# Patient Record
Sex: Female | Born: 1955 | Race: Black or African American | Hispanic: No | Marital: Married | Smoking: Never smoker
Health system: Southern US, Community
[De-identification: ages and names within clinical notes are randomized; demographics above are authoritative.]

## PROBLEM LIST (undated history)

## (undated) DIAGNOSIS — I10 Essential (primary) hypertension: Secondary | ICD-10-CM

## (undated) DIAGNOSIS — I219 Acute myocardial infarction, unspecified: Secondary | ICD-10-CM

## (undated) DIAGNOSIS — D649 Anemia, unspecified: Secondary | ICD-10-CM

## (undated) DIAGNOSIS — E669 Obesity, unspecified: Secondary | ICD-10-CM

## (undated) DIAGNOSIS — E785 Hyperlipidemia, unspecified: Secondary | ICD-10-CM

## (undated) HISTORY — DX: Acute myocardial infarction, unspecified: I21.9

## (undated) HISTORY — DX: Hyperlipidemia, unspecified: E78.5

## (undated) HISTORY — PX: CARDIAC CATHETERIZATION: SHX172

## (undated) HISTORY — PX: TUBAL LIGATION: SHX77

## (undated) HISTORY — DX: Obesity, unspecified: E66.9

## (undated) HISTORY — DX: Essential (primary) hypertension: I10

---

## 2004-04-24 ENCOUNTER — Emergency Department (HOSPITAL_COMMUNITY): Admission: EM | Admit: 2004-04-24 | Discharge: 2004-04-24 | Payer: Self-pay | Admitting: Emergency Medicine

## 2004-05-10 ENCOUNTER — Ambulatory Visit (HOSPITAL_COMMUNITY): Admission: RE | Admit: 2004-05-10 | Discharge: 2004-05-10 | Payer: Self-pay | Admitting: Internal Medicine

## 2004-07-27 ENCOUNTER — Encounter (HOSPITAL_COMMUNITY): Admission: RE | Admit: 2004-07-27 | Discharge: 2004-08-26 | Payer: Self-pay | Admitting: Oncology

## 2004-07-27 ENCOUNTER — Encounter: Admission: RE | Admit: 2004-07-27 | Discharge: 2004-07-27 | Payer: Self-pay | Admitting: Oncology

## 2004-11-16 ENCOUNTER — Encounter (HOSPITAL_COMMUNITY): Admission: RE | Admit: 2004-11-16 | Discharge: 2004-12-16 | Payer: Self-pay | Admitting: Oncology

## 2004-11-16 ENCOUNTER — Ambulatory Visit (HOSPITAL_COMMUNITY): Payer: Self-pay | Admitting: Oncology

## 2004-11-16 ENCOUNTER — Encounter: Admission: RE | Admit: 2004-11-16 | Discharge: 2004-11-16 | Payer: Self-pay | Admitting: Oncology

## 2006-09-12 ENCOUNTER — Ambulatory Visit (HOSPITAL_COMMUNITY): Payer: Self-pay | Admitting: Oncology

## 2006-10-01 ENCOUNTER — Ambulatory Visit (HOSPITAL_COMMUNITY): Payer: Self-pay | Admitting: Oncology

## 2007-06-24 ENCOUNTER — Encounter (HOSPITAL_COMMUNITY): Admission: RE | Admit: 2007-06-24 | Discharge: 2007-07-16 | Payer: Self-pay | Admitting: Oncology

## 2007-07-03 ENCOUNTER — Ambulatory Visit (HOSPITAL_COMMUNITY): Payer: Self-pay | Admitting: Oncology

## 2007-08-23 ENCOUNTER — Encounter (HOSPITAL_COMMUNITY): Admission: RE | Admit: 2007-08-23 | Discharge: 2007-09-22 | Payer: Self-pay | Admitting: Oncology

## 2007-08-23 ENCOUNTER — Ambulatory Visit (HOSPITAL_COMMUNITY): Payer: Self-pay | Admitting: Oncology

## 2008-02-19 ENCOUNTER — Ambulatory Visit (HOSPITAL_COMMUNITY): Payer: Self-pay | Admitting: Oncology

## 2008-02-19 ENCOUNTER — Encounter (HOSPITAL_COMMUNITY): Admission: RE | Admit: 2008-02-19 | Discharge: 2008-03-20 | Payer: Self-pay | Admitting: Oncology

## 2008-04-29 ENCOUNTER — Ambulatory Visit (HOSPITAL_COMMUNITY): Payer: Self-pay | Admitting: Oncology

## 2008-06-02 ENCOUNTER — Ambulatory Visit (HOSPITAL_COMMUNITY): Admission: RE | Admit: 2008-06-02 | Discharge: 2008-06-02 | Payer: Self-pay | Admitting: Obstetrics & Gynecology

## 2010-03-22 ENCOUNTER — Ambulatory Visit: Payer: Self-pay

## 2011-01-23 ENCOUNTER — Inpatient Hospital Stay (HOSPITAL_COMMUNITY)
Admission: EM | Admit: 2011-01-23 | Discharge: 2011-01-26 | DRG: 247 | Disposition: A | Discharge status: Home / Self Care | Payer: PRIVATE HEALTH INSURANCE | Attending: Cardiology | Admitting: Cardiology

## 2011-01-23 DIAGNOSIS — Z7982 Long term (current) use of aspirin: Secondary | ICD-10-CM

## 2011-01-23 DIAGNOSIS — I2510 Atherosclerotic heart disease of native coronary artery without angina pectoris: Secondary | ICD-10-CM | POA: Diagnosis present

## 2011-01-23 DIAGNOSIS — Z794 Long term (current) use of insulin: Secondary | ICD-10-CM

## 2011-01-23 DIAGNOSIS — I2119 ST elevation (STEMI) myocardial infarction involving other coronary artery of inferior wall: Principal | ICD-10-CM | POA: Diagnosis present

## 2011-01-23 DIAGNOSIS — E669 Obesity, unspecified: Secondary | ICD-10-CM | POA: Diagnosis present

## 2011-01-23 DIAGNOSIS — I2109 ST elevation (STEMI) myocardial infarction involving other coronary artery of anterior wall: Secondary | ICD-10-CM | POA: Insufficient documentation

## 2011-01-23 DIAGNOSIS — I10 Essential (primary) hypertension: Secondary | ICD-10-CM | POA: Diagnosis present

## 2011-01-23 DIAGNOSIS — R079 Chest pain, unspecified: Secondary | ICD-10-CM

## 2011-01-23 DIAGNOSIS — E119 Type 2 diabetes mellitus without complications: Secondary | ICD-10-CM | POA: Diagnosis present

## 2011-01-23 LAB — LIPID PANEL
Cholesterol: 142 mg/dL (ref 0–200)
HDL: 32 mg/dL — ABNORMAL LOW (ref >39)
LDL Cholesterol: 79 mg/dL (ref 0–99)
Total CHOL/HDL Ratio: 4.4 RATIO
Triglycerides: 153 mg/dL — ABNORMAL HIGH (ref <150)
VLDL: 31 mg/dL (ref 0–40)

## 2011-01-23 LAB — CBC
HCT: 34 % — ABNORMAL LOW (ref 36.0–46.0)
Hemoglobin: 11.4 g/dL — ABNORMAL LOW (ref 12.0–15.0)
MCH: 27.5 pg (ref 26.0–34.0)
MCHC: 33.5 g/dL (ref 30.0–36.0)
MCV: 82.1 fL (ref 78.0–100.0)
Platelets: 210 10*3/uL (ref 150–400)
RBC: 4.14 MIL/uL (ref 3.87–5.11)
RDW: 12.9 % (ref 11.5–15.5)
WBC: 8.2 10*3/uL (ref 4.0–10.5)

## 2011-01-23 LAB — COMPREHENSIVE METABOLIC PANEL WITH GFR
ALT: 20 U/L (ref 0–35)
AST: 19 U/L (ref 0–37)
Albumin: 3.5 g/dL (ref 3.5–5.2)
Alkaline Phosphatase: 63 U/L (ref 39–117)
BUN: 10 mg/dL (ref 6–23)
CO2: 24 meq/L (ref 19–32)
Calcium: 8.4 mg/dL (ref 8.4–10.5)
Chloride: 97 meq/L (ref 96–112)
Creatinine, Ser: 0.82 mg/dL (ref 0.4–1.2)
GFR calc non Af Amer: >60 mL/min
Glucose, Bld: 299 mg/dL — ABNORMAL HIGH (ref 70–99)
Potassium: 3.2 meq/L — ABNORMAL LOW (ref 3.5–5.1)
Sodium: 132 meq/L — ABNORMAL LOW (ref 135–145)
Total Bilirubin: 0.6 mg/dL (ref 0.3–1.2)
Total Protein: 6.4 g/dL (ref 6.0–8.3)

## 2011-01-23 LAB — CARDIAC PANEL(CRET KIN+CKTOT+MB+TROPI)
CK, MB: 1 ng/mL (ref 0.3–4.0)
Relative Index: INVALID (ref 0.0–2.5)
Total CK: 83 U/L (ref 7–177)
Troponin I: 0.11 ng/mL — ABNORMAL HIGH (ref 0.00–0.06)

## 2011-01-23 LAB — APTT

## 2011-01-23 LAB — PROTIME-INR
INR: 5.99 (ref 0.00–1.49)
Prothrombin Time: 53.1 seconds — ABNORMAL HIGH (ref 11.6–15.2)

## 2011-01-23 LAB — MRSA PCR SCREENING: MRSA by PCR: NEGATIVE

## 2011-01-24 LAB — POCT I-STAT, CHEM 8
BUN: 10 mg/dL (ref 6–23)
Calcium, Ion: 1.13 mmol/L (ref 1.12–1.32)
Chloride: 97 mEq/L (ref 96–112)
Creatinine, Ser: 0.9 mg/dL (ref 0.4–1.2)
Glucose, Bld: 307 mg/dL — ABNORMAL HIGH (ref 70–99)
HCT: 37 % (ref 36.0–46.0)
Hemoglobin: 12.6 g/dL (ref 12.0–15.0)
Potassium: 2.8 mEq/L — ABNORMAL LOW (ref 3.5–5.1)
Sodium: 136 mEq/L (ref 135–145)
TCO2: 22 mmol/L (ref 0–100)

## 2011-01-24 LAB — CBC
HCT: 32.9 % — ABNORMAL LOW (ref 36.0–46.0)
Hemoglobin: 10.9 g/dL — ABNORMAL LOW (ref 12.0–15.0)
MCH: 27.5 pg (ref 26.0–34.0)
MCHC: 33.1 g/dL (ref 30.0–36.0)
MCV: 82.9 fL (ref 78.0–100.0)
Platelets: 218 10*3/uL (ref 150–400)
RBC: 3.97 MIL/uL (ref 3.87–5.11)
RDW: 13.1 % (ref 11.5–15.5)
WBC: 6.7 10*3/uL (ref 4.0–10.5)

## 2011-01-24 LAB — CARDIAC PANEL(CRET KIN+CKTOT+MB+TROPI)
CK, MB: 5.6 ng/mL — ABNORMAL HIGH (ref 0.3–4.0)
CK, MB: 4.4 ng/mL — ABNORMAL HIGH (ref 0.3–4.0)
CK, MB: 2.9 ng/mL (ref 0.3–4.0)
Relative Index: 4.6 — ABNORMAL HIGH (ref 0.0–2.5)
Relative Index: 3.3 — ABNORMAL HIGH (ref 0.0–2.5)
Relative Index: 2.5 (ref 0.0–2.5)
Total CK: 123 U/L (ref 7–177)
Total CK: 132 U/L (ref 7–177)
Total CK: 117 U/L (ref 7–177)
Troponin I: 1.32 ng/mL (ref 0.00–0.06)
Troponin I: 1.12 ng/mL (ref 0.00–0.06)
Troponin I: 0.84 ng/mL (ref 0.00–0.06)

## 2011-01-24 LAB — GLUCOSE, CAPILLARY
Glucose-Capillary: 188 mg/dL — ABNORMAL HIGH (ref 70–99)
Glucose-Capillary: 215 mg/dL — ABNORMAL HIGH (ref 70–99)
Glucose-Capillary: 236 mg/dL — ABNORMAL HIGH (ref 70–99)

## 2011-01-24 LAB — BASIC METABOLIC PANEL
BUN: 12 mg/dL (ref 6–23)
CO2: 25 mEq/L (ref 19–32)
Calcium: 8.9 mg/dL (ref 8.4–10.5)
Chloride: 104 mEq/L (ref 96–112)
Creatinine, Ser: 0.98 mg/dL (ref 0.4–1.2)
GFR calc Af Amer: >60 mL/min (ref >60)
GFR calc non Af Amer: 59 mL/min — ABNORMAL LOW (ref >60)
Glucose, Bld: 229 mg/dL — ABNORMAL HIGH (ref 70–99)
Potassium: 3.7 mEq/L (ref 3.5–5.1)
Sodium: 140 mEq/L (ref 135–145)

## 2011-01-24 LAB — POCT ACTIVATED CLOTTING TIME: Activated Clotting Time: 529 seconds

## 2011-01-24 LAB — PROTIME-INR
INR: 1.16 (ref 0.00–1.49)
Prothrombin Time: 15 seconds (ref 11.6–15.2)

## 2011-01-24 LAB — HEMOGLOBIN A1C
Hgb A1c MFr Bld: 11.1 % — ABNORMAL HIGH (ref <5.7)
Mean Plasma Glucose: 272 mg/dL — ABNORMAL HIGH (ref <117)

## 2011-01-25 ENCOUNTER — Inpatient Hospital Stay (HOSPITAL_COMMUNITY): Payer: PRIVATE HEALTH INSURANCE

## 2011-01-25 LAB — CBC
HCT: 35.6 % — ABNORMAL LOW (ref 36.0–46.0)
Hemoglobin: 11.9 g/dL — ABNORMAL LOW (ref 12.0–15.0)
MCH: 28.1 pg (ref 26.0–34.0)
MCHC: 33.4 g/dL (ref 30.0–36.0)
MCV: 84 fL (ref 78.0–100.0)
Platelets: 231 10*3/uL (ref 150–400)
RBC: 4.24 MIL/uL (ref 3.87–5.11)
RDW: 13.4 % (ref 11.5–15.5)
WBC: 5.4 10*3/uL (ref 4.0–10.5)

## 2011-01-25 LAB — BASIC METABOLIC PANEL
BUN: 13 mg/dL (ref 6–23)
CO2: 23 mEq/L (ref 19–32)
Calcium: 9.4 mg/dL (ref 8.4–10.5)
Chloride: 107 mEq/L (ref 96–112)
Creatinine, Ser: 0.92 mg/dL (ref 0.4–1.2)
GFR calc Af Amer: >60 mL/min (ref >60)
GFR calc non Af Amer: >60 mL/min (ref >60)
Glucose, Bld: 188 mg/dL — ABNORMAL HIGH (ref 70–99)
Potassium: 4.5 mEq/L (ref 3.5–5.1)
Sodium: 140 mEq/L (ref 135–145)

## 2011-01-25 LAB — GLUCOSE, CAPILLARY
Glucose-Capillary: 184 mg/dL — ABNORMAL HIGH (ref 70–99)
Glucose-Capillary: 182 mg/dL — ABNORMAL HIGH (ref 70–99)
Glucose-Capillary: 242 mg/dL — ABNORMAL HIGH (ref 70–99)
Glucose-Capillary: 208 mg/dL — ABNORMAL HIGH (ref 70–99)
Glucose-Capillary: 195 mg/dL — ABNORMAL HIGH (ref 70–99)

## 2011-01-26 DIAGNOSIS — I2119 ST elevation (STEMI) myocardial infarction involving other coronary artery of inferior wall: Secondary | ICD-10-CM

## 2011-01-26 LAB — GLUCOSE, CAPILLARY: Glucose-Capillary: 181 mg/dL — ABNORMAL HIGH (ref 70–99)

## 2011-01-26 NOTE — Procedures (Signed)
NAMESHARDE, GOVER             ACCOUNT NO.:  [id number]  MEDICAL RECORD NO.:  [id number]           PATIENT TYPE:  I  LOCATION:  2914                         FACILITY:  [facility]  PHYSICIAN:  Arturo Morton. Riley Kill, MD, FACCDATE OF BIRTH:  1956-10-10  DATE OF PROCEDURE:  01/23/2011 DATE OF DISCHARGE:                           CARDIAC CATHETERIZATION   INDICATIONS:  The patient is a 55 year old diabetic hypertensive who presents with chest pain.  EKG was obtained in the field by [facility] and findings were consistent with inferior wall myocardial infarction.  She was brought emergently to the cardiac catheterization laboratory for urgent catheterization.  She had a diagnostic electrocardiogram.  She had ongoing chest pain.  PROCEDURES: 1. Left heart catheterization. 2. Selective coronary arteriography. 3. Selective left ventriculography. 4. Percutaneous stenting of the right coronary artery with drug-     eluting stent.  DESCRIPTION OF PROCEDURE:  The patient was brought to the cath lab, prepped and draped in usual fashion.  We carefully measured using the femoral head under fluoroscopy site of insertion, and the femoral artery was entered.  A sheath was placed.  Views of the left and right coronary arteries were then obtained.  She had a subtotal occlusion of the right coronary artery.  Heparin was given on arrival.  Bivalirudin was then given according to protocol.  Prasugrel was administered as a 60 mg oral dose.  ACT was subsequently checked.  I-stat creatinine and hemoglobin were obtained.  Creatinine was normal.  Preparations were made for percutaneous intervention.  A JR-4 guiding catheter with side holes was utilized with a Research scientist (physical sciences).  The lesion was dilated with an 2.25-mm balloon, and stented with a 20-mm length x 300 PROMUS Element drug- eluting stent with the patient's known diabetes.  This was deployed at about 13-14 atmospheres.  A 3.5 mm post dilatation  noncompliant balloon was then placed within the confines of the stent, and several post dilatations performed.  We then redilated after this.  Following this, final shots were obtained.  Central aortic and left ventricular pressures were then measured with pigtail and ventriculography was performed in the RAO projection.  All catheters were subsequently removed and then we took another femoral angiogram.  Despite the site of entry being just slightly inferior to the femoral head by fluoroscopy, the actual entry site was high and there was only a small amount of sheath within the femoral artery and therefore the sheath was upgraded to a 23 cm 6-French sheath.  Bivalirudin was continued with plans for subsequent discontinuation.  I spoke with the patient's family.  She was taken to the CCU in a satisfactory clinical condition.  Of note, the patient also received 4 doses of intravenous metoprolol of 2.5 mg which brought the blood pressure down as well as the heart rate.  HEMODYNAMIC DATA: 1. The central aortic pressure was 144/98, mean 121. 2. Left ventricular pressure 124/60. 3. There was no gradient or pullback across the aortic valve.  ANGIOGRAPHIC DATA: 1. Ventriculography in the RAO projection reveals preserved overall     global systolic function.  There may be a small amount of inferior  wall hypokinesis.  Ejection fraction is in excess of 50%. 2. The left main is free of critical disease.  The left anterior     descending artery has a diabetic appearance that courses to the     apical tip.  It is somewhat diffusely plaqued, but without focal     stenosis.  The midportion of the artery has approximately 30% to     40% area where a smaller diagonal comes off distally.  There is an     approximate 70% to 75% area of stenosis that is small diffusely     diseased segment of the vessel.  There is a major diagonal branch,     which also has about 70% to 75% mid-narrowing, both of  these have a     typical diabetic appearance. 3. The circumflex demonstrates a tiny first marginal then a second     large marginal branch.  There is diffuse plaquing of this with     about 40% to 50% narrowing in the midportion and diffuse luminal     irregularity distally.  The AV circumflex is small.  CONCLUSIONS: 1. Acute inferior wall myocardial infarction. 2. Spontaneous reperfusion after the intravenous administration of     heparin. 3. 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 4. Scattered disease of the left coronary system as described in the     above text.  DISPOSITION:  At the present time, the patient will be treated medically.  Cardiac rehab will get involved.  She will be treated with aspirin and prasugrel.  She will be given beta-blockers, a statin, and ACE inhibition will be resumed.  She will be started back on metformin in 48 hours.  Long-term followup will be in the [facility].     Arturo Morton. Riley Kill, MD, [facility]     TDS/MEDQ  D:  01/23/2011  T:  01/24/2011  Job:  [id number]  Electronically Signed by Shawnie Pons MD [facility] on 01/26/2011 05:45:06 AM

## 2011-01-31 ENCOUNTER — Telehealth: Payer: Self-pay | Admitting: Cardiology

## 2011-01-31 NOTE — Telephone Encounter (Signed)
Pt wants to know if the doctor received papers for short term disability.

## 2011-02-01 NOTE — Telephone Encounter (Signed)
I spoke with the pt and at this time I have not received FMLA paperwork on this pt.  I made the pt aware that she will be following up in the [place] office with Dr Dietrich Pates.  The pt will touch base with her employer and I made her aware that she can have the [place] office complete her paperwork since they will be following her long-term.  I also told the pt that I would let her know if paperwork does come to Dr Riley Kill.

## 2011-02-09 ENCOUNTER — Ambulatory Visit (INDEPENDENT_AMBULATORY_CARE_PROVIDER_SITE_OTHER): Payer: PRIVATE HEALTH INSURANCE | Admitting: Adult Health

## 2011-02-09 ENCOUNTER — Encounter: Payer: Self-pay | Admitting: Adult Health

## 2011-02-09 ENCOUNTER — Ambulatory Visit: Payer: PRIVATE HEALTH INSURANCE | Admitting: Adult Health

## 2011-02-09 DIAGNOSIS — I2109 ST elevation (STEMI) myocardial infarction involving other coronary artery of anterior wall: Secondary | ICD-10-CM

## 2011-02-09 DIAGNOSIS — I973 Postprocedural hypertension: Secondary | ICD-10-CM | POA: Insufficient documentation

## 2011-02-09 DIAGNOSIS — E782 Mixed hyperlipidemia: Secondary | ICD-10-CM

## 2011-02-09 DIAGNOSIS — E7800 Pure hypercholesterolemia, unspecified: Secondary | ICD-10-CM

## 2011-02-09 DIAGNOSIS — I10 Essential (primary) hypertension: Secondary | ICD-10-CM

## 2011-02-09 NOTE — Assessment & Plan Note (Addendum)
She is doing well at present without recurrence of symptoms.  She is beginning to walk more and will be a part of cardiac rehab.  She continues to be medically complaint and has had no need to use NTG.  She is trying to stay on a heart healthy diet.  This is reinforced to her.  No changes on her medications at this time.  Will repeat BMET in 3 months on new medications. EF was 50% per cath. Will not repeat ECHO unless necessary with symptoms of CHF. She is allowed to drive. She will return to work the end of May.

## 2011-02-09 NOTE — Assessment & Plan Note (Signed)
Cholesterol status will be reassessed in 3 months.  No baseline labs on hospital records for lipids.  She is advised to continue low cholesterol diet and increase activity.

## 2011-02-09 NOTE — Progress Notes (Signed)
HPI: Frances Miller is here for follow-up after admission to [facility] in the setting of an acute anterior STEMI with subseqent emergent cardiac catheterization demonstarting a 99% mid RCA stenosis.  She had placement of 3.0X103mm Promus stent DES placed.  She had non-obstructive disease elsewhere.  She is now on Aspirin, Coreg, Effient , and lipitor and lisinopril.  She has tolerated the medications fairly well if she takes them with food. She has a history of Diabetes, hypertension and mild obesity.    She has been walking with family and has had no recurrence of chest discomfort or dyspnea.  She has had no bruising or bleeding issues. She is still mildly tender in the right groin site of catheter insertion.    No Known Allergies  Current Outpatient Prescriptions  Medication Sig Dispense Refill  . aspirin 81 MG tablet Take 81 mg by mouth daily.        Marland Kitchen atorvastatin (LIPITOR) 80 MG tablet Take 80 mg by mouth daily.        . carvedilol (COREG) 3.125 MG tablet Take 3.125 mg by mouth 2 (two) times daily with a meal.        . insulin glargine (LANTUS) 100 UNIT/ML injection Inject 30 Units into the skin at bedtime.        Marland Kitchen lisinopril (PRINIVIL,ZESTRIL) 10 MG tablet Take 10 mg by mouth daily.        . metFORMIN (GLUCOPHAGE) 500 MG tablet Take 500 mg by mouth 2 (two) times daily with a meal.        . nitroGLYCERIN (NITROSTAT) 0.4 MG SL tablet Place 0.4 mg under the tongue every 5 (five) minutes as needed.        . prasugrel (EFFIENT) 10 MG TABS Take 10 mg by mouth daily.          Past Medical History  Diagnosis Date  . Hypertension   . Hyperlipidemia   . Diabetes mellitus   . Obesity     Past Surgical History  Procedure Date  . Tubal ligation   . Cardiac catheterization     UEA:VWUJWJ of systems complete and found to be negative unless listed above PHYSICAL EXAM BP 156/84  Pulse 68  Ht 5' (1.524 m)  Wt 191 lb (86.637 kg)  BMI 37.30 kg/m2  SpO2 96% General: Well developed, well  nourished, in no acute distress Head: Eyes PERRLA, No xanthomas.   Normal cephalic and atramatic  Lungs: Clear bilaterally to auscultation and percussion. Heart: HRRR S1 S2, .  Pulses are 2+ & equal.            No carotid bruit. No JVD.  No abdominal bruits. No femoral bruits. Abdomen: Bowel sounds are positive, abdomen soft and non-tender without masses or                  Hernia's noted. Msk:  Back normal, normal gait. Normal strength and tone for age. Extremities: No clubbing, cyanosis or edema.  DP +1. No bleeding bruising or hematoma at cath site. Neuro: Alert and oriented X 3. Psych:  Good affect, responds appropriately EKG:NSR rate of 63 bpm, with anterior Q-waves in V3 and III.  ASSESSMENT AND PLAN

## 2011-02-09 NOTE — Assessment & Plan Note (Signed)
Moderately controlled at present. Will not change medications at this time unless persistently elevated.

## 2011-02-09 NOTE — Patient Instructions (Signed)
**Note De-Identified Assad Harbeson Obfuscation** Your physician recommends that you return for lab work in: 3 months, just before next office visit.  Your physician recommends that you continue on your current medications as directed. Please refer to the Current Medication list given to you today.  Your physician recommends that you schedule a follow-up appointment in: 3 months

## 2011-02-24 ENCOUNTER — Telehealth: Payer: Self-pay | Admitting: Cardiology

## 2011-02-24 NOTE — Telephone Encounter (Signed)
Out of work letter until 03/17/11

## 2011-02-24 NOTE — Telephone Encounter (Signed)
Dr.Rothbart wrote patient out until Feb 24, 2011 / patient wants to know why she can't be out until the end of the month / pls return call / tg

## 2011-03-02 ENCOUNTER — Ambulatory Visit (HOSPITAL_COMMUNITY): Payer: PRIVATE HEALTH INSURANCE

## 2011-03-03 NOTE — Op Note (Signed)
NAME:  Frances Miller, Frances Miller                       ACCOUNT NO.:  [id number]   MEDICAL RECORD NO.:  [id number]                   PATIENT TYPE:  AMB   LOCATION:  DAY                                  FACILITY:  [facility]   PHYSICIAN:  Lionel December, M.D.                 DATE OF BIRTH:  [birth year]   DATE OF PROCEDURE:  05/10/2004  DATE OF DISCHARGE:                                 OPERATIVE REPORT   PROCEDURE:  Total colonoscopy.   INDICATIONS FOR PROCEDURE:  Frances Miller is a 55 year old African American  female who was recently found to have iron deficiency anemia.  Her stools  have been negative.  It was felt that her __________ .  However, her family  history is positive for colon carcinoma in her mother, and therefore, Dr.  Ouida Sills requested colonoscopy to make sure she does not have any occult  process in her colon.  She does not have any GI symptoms.  The procedure and  risks were reviewed with the patient, and informed consent was obtained.   PREOPERATIVE MEDICATIONS:  Demerol 25 mg IV, Versed 5 mg IV.   FINDINGS:  The procedure was performed in the endoscopy suite.  The  patient's vital signs and O2 saturations were monitored during the procedure  and remained stable.  The patient was placed in the left lateral recumbent  position and rectal examination performed.  No abnormality noted on external  or digital exam.  The Olympus videoscope was placed into the rectum and  advanced into the region of the sigmoid colon and beyond.  She had a lot of  thick liquid stool which was suctioned out.  The scope was passed to the  cecum which was identified by the ileocecal valve.  The appendiceal orifice  was located just behind the valve and was well-seen but not photographed.  There was a small polyp on a fold across from the ileocecal valve which was  ablated by cold biopsy.  As the scope was withdrawn, the colonic mucosa was  once again carefully examined and was normal throughout.  The rectal  mucosa  similarly was normal.  The scope was retroflexed to examine the anorectal  junction, and small hemorrhoids were noted below the dentate line.  The  endoscope was straightened and withdrawn.  The patient tolerated the  procedure well.   FINAL DIAGNOSES:  1. Small polyp at cecum which was ablated by cold biopsy.  2. Small external hemorrhoids.  Otherwise normal examination.   RECOMMENDATIONS:  1. She will resume her iron therapy as before.  2. I will be contacting the patient with the biopsy results.  3. Given her family history, she should consider having her next exam in     five years from now.      ___________________________________________  Lionel December, M.D.   NR/MEDQ  D:  05/10/2004  T:  05/10/2004  Job:  [id number]   cc:   Kingsley Callander. Ouida Sills, M.D.  [address]  [place]  [place] [address]  Fax: [phone]

## 2011-03-06 ENCOUNTER — Encounter (HOSPITAL_COMMUNITY)
Admission: RE | Admit: 2011-03-06 | Discharge: 2011-03-06 | Disposition: A | Discharge status: Home / Self Care | Payer: PRIVATE HEALTH INSURANCE | Source: Ambulatory Visit | Attending: Cardiology | Admitting: Cardiology

## 2011-03-06 DIAGNOSIS — Z9861 Coronary angioplasty status: Secondary | ICD-10-CM | POA: Insufficient documentation

## 2011-03-06 DIAGNOSIS — I252 Old myocardial infarction: Secondary | ICD-10-CM | POA: Insufficient documentation

## 2011-03-06 DIAGNOSIS — Z5189 Encounter for other specified aftercare: Secondary | ICD-10-CM | POA: Insufficient documentation

## 2011-03-06 DIAGNOSIS — I2510 Atherosclerotic heart disease of native coronary artery without angina pectoris: Secondary | ICD-10-CM | POA: Insufficient documentation

## 2011-03-08 ENCOUNTER — Encounter (HOSPITAL_COMMUNITY): Payer: PRIVATE HEALTH INSURANCE

## 2011-03-10 ENCOUNTER — Encounter (HOSPITAL_COMMUNITY): Payer: PRIVATE HEALTH INSURANCE

## 2011-03-13 ENCOUNTER — Encounter (HOSPITAL_COMMUNITY): Payer: PRIVATE HEALTH INSURANCE

## 2011-03-15 ENCOUNTER — Encounter (HOSPITAL_COMMUNITY): Payer: PRIVATE HEALTH INSURANCE

## 2011-03-15 ENCOUNTER — Telehealth: Payer: Self-pay | Admitting: Cardiology

## 2011-03-15 NOTE — Telephone Encounter (Signed)
Patient has questions regarding paperwork that was faxed over by [facility] regarding duties for returning back to work / tg

## 2011-03-16 NOTE — Telephone Encounter (Signed)
Last office visit by K. Lawrence,NP, pt able to return to full duty  Pt upset stating she can not pull "cow" around , no cardiac reason for disability per paperwork Pt is upset and will pick up paperwork at front desk

## 2011-03-16 NOTE — Discharge Summary (Signed)
NAMEMAESON, Frances Miller             ACCOUNT NO.:  [id number]  MEDICAL RECORD NO.:  [id number]           PATIENT TYPE:  I  LOCATION:  2016                         FACILITY:  [facility]  PHYSICIAN:  Arturo Morton. Riley Kill, MD, FACCDATE OF BIRTH:  [birth year]  DATE OF ADMISSION:  01/23/2011 DATE OF DISCHARGE:  01/26/2011                              DISCHARGE SUMMARY   PRIMARY CARDIOLOGIST:  Gerrit Friends. Dietrich Pates, MD, [facility] in [place].  PRIMARY CARE PROVIDER:  Kingsley Callander. Ouida Sills, MD  DISCHARGE DIAGNOSIS:  Acute inferior ST-segment elevation myocardial infarction.  SECONDARY DIAGNOSES: 1. Hypertension. 2. Hyperlipidemia. 3. Diabetes mellitus. 4. Obesity. 5. Status post tubal ligation.  ALLERGIES:  No known drug allergies.  PROCEDURES:  Left heart cardiac catheterization revealing 99% stenosis in the mid right coronary artery with otherwise nonobstructive coronary artery disease and an EF of 50%.  The RCA was successfully stented with 3.0 x 20 mm Promus Element Plus drug-eluting stent.  HISTORY OF PRESENT ILLNESS:  A 55 year old female without prior cardiac history who was in her usual state of health until January 23, 2011, when she had sudden onset of substernal chest pressure with radiation to her left arm without associated symptoms.  EMS was called and upon ECG evaluation, it was determined she had inferior ST-segment elevation and the Code STEMI was activated.  The patient then was taken to [facility] for further evaluation.  HOSPITAL COURSE:  The patient underwent emergent diagnostic cardiac catheterization, revealing 99% stenosis in the mid right coronary artery with otherwise nonobstructive disease and normal LV function.  The patient was loaded with Effient and underwent successful PCI and stenting of the right coronary artery with placement of 3.0 x 20 mm Promus Element Plus drug-eluting stent.  The patient tolerated the procedure well and postprocedure was monitored in the  coronary intensive care unit where she was maintained on aspirin, Effient, statin, beta- blocker, and ACE inhibitor therapy.  The patient was subsequently transferred out to the floor on January 24, 2011, and has been ambulating without recurrent symptoms or limitations.  She has been evaluated by Cardiac Rehab with outpatient referral made to [facility].  The patient will be discharged home today in good condition.  DISCHARGE LABS:  Hemoglobin 11.9, hematocrit 35.6, WBC 5.4, platelets 231.  INR 1.16.  Sodium 140, potassium 4.5, chloride 107, CO2 23, BUN 13, creatinine 0.92, glucose 188.  Total bilirubin 0.6, alkaline phosphatase 63, AST 19, ALT 20, total protein 6.4, albumin 3.5, calcium 9.4.  Hemoglobin A1c 11.1.  CK 117, MB 2.9, troponin I 0.84.  Total cholesterol 142, triglycerides 153, HDL 32, LDL 79.  MRSA screen was negative.  DISPOSITION:  The patient will be discharged home today in good condition.  FOLLOWUP PLANS AND APPOINTMENTS:  The patient has followup arranged with Joni Reining, nurse practitioner, at [facility] on February 09, 2011, at 11:20 a.m.  She will follow with Dr. Carylon Perches as previously scheduled.  DISCHARGE MEDICATIONS: 1. Aspirin 81 mg daily. 2. Coreg 3.125 mg b.i.d. 3. Lipitor 80 mg at bedtime. 4. Lisinopril 10 mg daily. 5. Nitroglycerin 0.4 mg sublingual p.r.n. chest pain. 6.  Prasugrel 10 mg daily. 7. Lantus 30 units at bedtime. 8. Metformin 500 mg 2 tablets b.i.d.  OUTSTANDING LAB STUDIES:  Followup lipids and LFTs in approximately 6-8 weeks given new statin therapy.  DURATION OF DISCHARGE ENCOUNTER:  45 minutes including physician time.     [name], ANP   ______________________________ Arturo Morton. Riley Kill, MD, [facility]    CB/MEDQ  D:  01/26/2011  T:  01/26/2011  Job:  [id number]  cc:   Kingsley Callander. Ouida Sills, MD  Electronically Signed by [name] ANP on 01/30/2011 03:04:48 PM Electronically Signed by Shawnie Pons MD  [facility] on 03/16/2011 09:11:58 AM

## 2011-03-16 NOTE — H&P (Signed)
Frances Miller, Frances Miller             ACCOUNT NO.:  [id number]  MEDICAL RECORD NO.:  [id number]           PATIENT TYPE:  I  LOCATION:  2914                         FACILITY:  [facility]  PHYSICIAN:  Arturo Morton. Riley Kill, MD, FACCDATE OF BIRTH:  06/15/1956  DATE OF ADMISSION:  01/23/2011 DATE OF DISCHARGE:                             HISTORY & PHYSICAL   PRIMARY CARDIOLOGIST:  Currently being evaluated by Dr. Riley Kill.  CHIEF COMPLAINT:  Chest pain/code STEMI.  HISTORY OF PRESENT ILLNESS:  This is a 55 year old African American female with history of diabetes mellitus and hypertension who states she was in her usual state of health until today.  The patient states she began to have retrosternal chest pressure that radiated to her left arm. She denies any associated symptoms of shortness of breath, nausea, or vomiting.  The patient was brought to [facility] via [facility] for a code STEMI.  Upon evaluation by EMS, the patient's chest pain was a 10/10.  She was given aspirin 324 mg, nitroglycerin x4 sublingually, and 2 mg of morphine.  Her chest pain is currently a 4/10. She is extremely anxious at this point in time and very teary-eyed. EKG, evaluated by Dr. Riley Kill does show 3-mm ST elevation in the inferior leads, residual ST depression in I, aVL, V1, and V2.  The patient has been brought emergently to the cath lab.  PAST MEDICAL HISTORY: 1. Insulin-dependent diabetes mellitus. 2. Hypertension. 3. Status post tubal ligation.  SOCIAL HISTORY:  The patient denies tobacco or alcohol abuse.  FAMILY HISTORY:  Noncontributory for early coronary artery disease, her father had strokes.  ALLERGIES:  No known drug allergies.  MEDICATIONS: 1. Metformin ER 500 mg 4 tablets daily. 2. Lisinopril/hydrochlorothiazide 20/12.5 mg 3 tablets daily. 3. Lantus insulin.  REVIEW OF SYSTEMS:  Condensed as of emergent situation, all positives and negatives as stated in the HPI.  PHYSICAL  EXAMINATION:  VITAL SIGNS:  Pulse 100, respirations 16, blood pressure 153/103, O2 saturation 98% on 2 L. GENERAL:  This is an extremely anxious, teary-eyed middle-aged female. The patient is still in pain during evaluation. HEENT:  Normal. HEART:  Regular rate and rhythm with S1, 2.  No murmurs noted.  Pulses are 2+ and equal. LUNGS:  Clear to auscultation anteriorly without wheezes, rales, or rhonchi. ABDOMEN:  Soft, obese, positive bowel sounds x4. EXTREMITIES:  No clubbing, cyanosis or edema. NEURO:  Alert and oriented x3, cranial nerves II-XII grossly intact.  Her physical exam shortened secondary to emergent situation.  EKG showing 3-mm ST elevation in the inferior leads.  There was residual ST depression in I and aVL, V1 and V2.  LABORATORY DATA:  Pending.  ASSESSMENT/PLAN:  This is a 55 year old African American female with history of hypertension and diabetes mellitus who presents with an acute inferior myocardial infarction per EKG.  The patient is still with chest pain.  A code STEMI has been activated and the patient will proceed with emergent cath with possible PCI by Dr. Riley Kill.  Emergent consent has been  obtained.  Her son has been informed and is agreeable.  The patient will be  taken to the  CCU post catheterization.  Further treatment will be dependent upon the above results.     Leonette Monarch, PA-C   ______________________________ Arturo Morton Riley Kill, MD, [facility]    NB/MEDQ  D:  01/23/2011  T:  01/24/2011  Job:  [id number]  Electronically Signed by Alen Blew P.A. on 01/26/2011 02:30:06 PM Electronically Signed by Shawnie Pons MD [facility] on 03/16/2011 09:11:53 AM

## 2011-03-17 ENCOUNTER — Encounter (HOSPITAL_COMMUNITY)
Admission: RE | Admit: 2011-03-17 | Discharge: 2011-03-17 | Disposition: A | Discharge status: Home / Self Care | Payer: PRIVATE HEALTH INSURANCE | Source: Ambulatory Visit | Attending: Cardiology | Admitting: Cardiology

## 2011-03-17 DIAGNOSIS — I252 Old myocardial infarction: Secondary | ICD-10-CM | POA: Insufficient documentation

## 2011-03-17 DIAGNOSIS — Z5189 Encounter for other specified aftercare: Secondary | ICD-10-CM | POA: Insufficient documentation

## 2011-03-17 DIAGNOSIS — I2510 Atherosclerotic heart disease of native coronary artery without angina pectoris: Secondary | ICD-10-CM | POA: Insufficient documentation

## 2011-03-17 DIAGNOSIS — Z9861 Coronary angioplasty status: Secondary | ICD-10-CM | POA: Insufficient documentation

## 2011-03-20 ENCOUNTER — Encounter (HOSPITAL_COMMUNITY): Payer: PRIVATE HEALTH INSURANCE

## 2011-03-22 ENCOUNTER — Encounter (HOSPITAL_COMMUNITY): Payer: PRIVATE HEALTH INSURANCE

## 2011-03-24 ENCOUNTER — Encounter (HOSPITAL_COMMUNITY): Payer: PRIVATE HEALTH INSURANCE

## 2011-03-27 ENCOUNTER — Encounter (HOSPITAL_COMMUNITY): Payer: PRIVATE HEALTH INSURANCE

## 2011-03-29 ENCOUNTER — Encounter (HOSPITAL_COMMUNITY): Payer: PRIVATE HEALTH INSURANCE

## 2011-03-31 ENCOUNTER — Encounter (HOSPITAL_COMMUNITY): Payer: PRIVATE HEALTH INSURANCE

## 2011-04-03 ENCOUNTER — Encounter (HOSPITAL_COMMUNITY): Payer: PRIVATE HEALTH INSURANCE

## 2011-04-05 ENCOUNTER — Encounter (HOSPITAL_COMMUNITY): Payer: PRIVATE HEALTH INSURANCE

## 2011-04-07 ENCOUNTER — Encounter (HOSPITAL_COMMUNITY): Payer: PRIVATE HEALTH INSURANCE

## 2011-04-10 ENCOUNTER — Encounter (HOSPITAL_COMMUNITY): Payer: PRIVATE HEALTH INSURANCE

## 2011-04-12 ENCOUNTER — Encounter (HOSPITAL_COMMUNITY): Payer: PRIVATE HEALTH INSURANCE

## 2011-04-14 ENCOUNTER — Encounter (HOSPITAL_COMMUNITY): Payer: PRIVATE HEALTH INSURANCE

## 2011-04-14 ENCOUNTER — Other Ambulatory Visit: Payer: Self-pay | Admitting: Adult Health

## 2011-04-15 LAB — LIPID PANEL
Cholesterol: 94 mg/dL (ref 0–200)
HDL: 31 mg/dL — ABNORMAL LOW (ref >39)
LDL Cholesterol: 40 mg/dL (ref 0–99)
Total CHOL/HDL Ratio: 3 Ratio
Triglycerides: 114 mg/dL (ref <150)
VLDL: 23 mg/dL (ref 0–40)

## 2011-04-15 LAB — HEPATIC FUNCTION PANEL
ALT: 16 U/L (ref 0–53)
AST: 17 U/L (ref 0–37)
Albumin: 4 g/dL (ref 3.5–5.2)
Alkaline Phosphatase: 67 U/L (ref 39–117)
Bilirubin, Direct: 0.1 mg/dL (ref 0.0–0.3)
Indirect Bilirubin: 0.4 mg/dL (ref 0.0–0.9)
Total Bilirubin: 0.5 mg/dL (ref 0.3–1.2)
Total Protein: 7.1 g/dL (ref 6.0–8.3)

## 2011-04-17 ENCOUNTER — Encounter (HOSPITAL_COMMUNITY): Payer: PRIVATE HEALTH INSURANCE

## 2011-04-17 DIAGNOSIS — I252 Old myocardial infarction: Secondary | ICD-10-CM | POA: Insufficient documentation

## 2011-04-17 DIAGNOSIS — I2510 Atherosclerotic heart disease of native coronary artery without angina pectoris: Secondary | ICD-10-CM | POA: Insufficient documentation

## 2011-04-17 DIAGNOSIS — Z9861 Coronary angioplasty status: Secondary | ICD-10-CM | POA: Insufficient documentation

## 2011-04-17 DIAGNOSIS — Z5189 Encounter for other specified aftercare: Secondary | ICD-10-CM | POA: Insufficient documentation

## 2011-04-19 ENCOUNTER — Encounter (HOSPITAL_COMMUNITY): Payer: PRIVATE HEALTH INSURANCE

## 2011-04-21 ENCOUNTER — Encounter (HOSPITAL_COMMUNITY)
Admission: RE | Admit: 2011-04-21 | Discharge: 2011-04-21 | Disposition: A | Discharge status: Home / Self Care | Payer: PRIVATE HEALTH INSURANCE | Source: Ambulatory Visit | Attending: Cardiology | Admitting: Cardiology

## 2011-04-24 ENCOUNTER — Encounter (HOSPITAL_COMMUNITY): Payer: PRIVATE HEALTH INSURANCE

## 2011-04-26 ENCOUNTER — Encounter (HOSPITAL_COMMUNITY): Payer: PRIVATE HEALTH INSURANCE

## 2011-04-28 ENCOUNTER — Encounter (HOSPITAL_COMMUNITY): Payer: PRIVATE HEALTH INSURANCE

## 2011-05-01 ENCOUNTER — Encounter (HOSPITAL_COMMUNITY): Payer: PRIVATE HEALTH INSURANCE

## 2011-05-03 ENCOUNTER — Encounter (HOSPITAL_COMMUNITY): Payer: PRIVATE HEALTH INSURANCE

## 2011-05-05 ENCOUNTER — Encounter (HOSPITAL_COMMUNITY): Payer: PRIVATE HEALTH INSURANCE

## 2011-05-08 ENCOUNTER — Encounter (HOSPITAL_COMMUNITY): Payer: PRIVATE HEALTH INSURANCE

## 2011-05-10 ENCOUNTER — Encounter (HOSPITAL_COMMUNITY): Payer: PRIVATE HEALTH INSURANCE

## 2011-05-12 ENCOUNTER — Ambulatory Visit: Payer: PRIVATE HEALTH INSURANCE | Admitting: Cardiology

## 2011-05-12 ENCOUNTER — Encounter (HOSPITAL_COMMUNITY): Payer: PRIVATE HEALTH INSURANCE

## 2011-05-15 ENCOUNTER — Encounter (HOSPITAL_COMMUNITY): Payer: PRIVATE HEALTH INSURANCE

## 2011-05-17 ENCOUNTER — Encounter (HOSPITAL_COMMUNITY): Payer: PRIVATE HEALTH INSURANCE

## 2011-05-19 ENCOUNTER — Encounter (HOSPITAL_COMMUNITY): Payer: PRIVATE HEALTH INSURANCE

## 2011-05-22 ENCOUNTER — Encounter (HOSPITAL_COMMUNITY): Payer: PRIVATE HEALTH INSURANCE

## 2011-05-24 ENCOUNTER — Encounter (HOSPITAL_COMMUNITY): Payer: PRIVATE HEALTH INSURANCE

## 2011-05-25 ENCOUNTER — Encounter: Payer: Self-pay | Admitting: Adult Health

## 2011-05-26 ENCOUNTER — Encounter (HOSPITAL_COMMUNITY): Payer: PRIVATE HEALTH INSURANCE

## 2011-05-29 ENCOUNTER — Encounter (HOSPITAL_COMMUNITY): Payer: PRIVATE HEALTH INSURANCE

## 2011-05-31 ENCOUNTER — Encounter (HOSPITAL_COMMUNITY): Payer: PRIVATE HEALTH INSURANCE

## 2011-06-02 ENCOUNTER — Encounter (HOSPITAL_COMMUNITY): Payer: PRIVATE HEALTH INSURANCE

## 2011-06-05 ENCOUNTER — Encounter (HOSPITAL_COMMUNITY): Payer: PRIVATE HEALTH INSURANCE

## 2011-06-07 ENCOUNTER — Encounter (HOSPITAL_COMMUNITY): Payer: PRIVATE HEALTH INSURANCE

## 2011-06-09 ENCOUNTER — Encounter (HOSPITAL_COMMUNITY): Payer: PRIVATE HEALTH INSURANCE

## 2011-06-12 ENCOUNTER — Encounter (HOSPITAL_COMMUNITY): Payer: PRIVATE HEALTH INSURANCE

## 2011-06-14 ENCOUNTER — Encounter (HOSPITAL_COMMUNITY): Payer: PRIVATE HEALTH INSURANCE

## 2011-06-16 ENCOUNTER — Encounter (HOSPITAL_COMMUNITY): Payer: PRIVATE HEALTH INSURANCE

## 2011-06-19 ENCOUNTER — Encounter (HOSPITAL_COMMUNITY): Payer: PRIVATE HEALTH INSURANCE

## 2011-06-21 ENCOUNTER — Encounter (HOSPITAL_COMMUNITY): Payer: PRIVATE HEALTH INSURANCE

## 2011-06-23 ENCOUNTER — Encounter (HOSPITAL_COMMUNITY): Payer: PRIVATE HEALTH INSURANCE

## 2011-06-26 ENCOUNTER — Encounter (HOSPITAL_COMMUNITY): Payer: PRIVATE HEALTH INSURANCE

## 2011-06-28 ENCOUNTER — Encounter (HOSPITAL_COMMUNITY): Payer: PRIVATE HEALTH INSURANCE

## 2011-06-30 NOTE — Progress Notes (Signed)
Cardiac Rehab Progress Report  Orientation:  03/02/2011 Graduate Date:  tbd Discharge Date: 04/21/2011  Cardiologist: Dr.  Diona Browner Family MD:  Dr. Alvia Grove Time:  11:00  A.  Exercise Program:  Tolerates exercise @ 3.3 METS for15 minutes and Discharged  B.  Mental Health:  Good mental attitude  C.  Education/Instruction/Skills  Knows THR for exercise and Uses Perceived Exertion Scale and/or Dyspnea Scale  D.  Nutrition/Weight Control/Body Composition:  Adherence to prescribed nutrition program: good   E.  Blood Lipids    Lab Results  Component Value Date   CHOL 94 04/13/2011     Lab Results  Component Value Date   TRIG 114 04/13/2011     Lab Results  Component Value Date   HDL 31* 04/13/2011     Lab Results  Component Value Date   CHOLHDL 3.0 04/13/2011     No results found for this basename: LDLDIRECT      F.  Lifestyle Changes:  Making positive lifestyle changes  G.  Symptoms noted with exercise:  Asymptomatic  Report Completed By:  Angelica Pou  Comments:  Pt did very well while in rehab. Stopped coming after her 12th session due to work. She achieved a peak mets of 3.3. And was motivated to exercise.

## 2011-06-30 NOTE — Progress Notes (Signed)
Encounter addended by: Angelica Pou, RN on: 06/30/2011 12:56 PM<BR>     Documentation filed: Inpatient Notes, Notes Section

## 2011-07-25 LAB — CBC
HCT: 39.1
Hemoglobin: 13.1
MCHC: 33.5
MCV: 85.1
Platelets: 262
RBC: 4.59
RDW: 16.3 — ABNORMAL HIGH
WBC: 5.1

## 2011-07-25 LAB — FERRITIN: Ferritin: 87 (ref 10–291)

## 2011-07-25 LAB — HEMOGLOBIN A1C
Hgb A1c MFr Bld: 7.4 — ABNORMAL HIGH
Mean Plasma Glucose: 186

## 2011-07-27 LAB — OCCULT BLOOD X 1 CARD TO LAB, STOOL
Fecal Occult Bld: NEGATIVE
Fecal Occult Bld: NEGATIVE
Fecal Occult Bld: NEGATIVE
Fecal Occult Bld: NEGATIVE
Fecal Occult Bld: NEGATIVE
Fecal Occult Bld: NEGATIVE

## 2011-08-29 ENCOUNTER — Other Ambulatory Visit: Payer: Self-pay

## 2011-09-05 ENCOUNTER — Other Ambulatory Visit: Payer: Self-pay | Admitting: *Deleted

## 2011-09-05 MED ORDER — CARVEDILOL 3.125 MG PO TABS: 3.1250 mg | ORAL_TABLET | Freq: Two times a day (BID) | ORAL | Status: DC

## 2011-12-20 ENCOUNTER — Other Ambulatory Visit: Payer: Self-pay

## 2011-12-20 MED ORDER — ATORVASTATIN CALCIUM 80 MG PO TABS: 80.0000 mg | ORAL_TABLET | Freq: Every day | ORAL | Status: DC

## 2011-12-20 NOTE — Telephone Encounter (Signed)
..   Requested Prescriptions   Signed Prescriptions Disp Refills  . atorvastatin (LIPITOR) 80 MG tablet 30 tablet 1    Sig: Take 1 tablet (80 mg total) by mouth daily.    Authorizing Provider: Tonny Bollman D    Ordering User: Renaye Janicki M  pt needs to make appointment to conitue getting refills

## 2012-02-26 ENCOUNTER — Ambulatory Visit: Payer: Self-pay | Admitting: Bariatrics

## 2012-03-15 ENCOUNTER — Ambulatory Visit: Payer: Self-pay | Admitting: Specialist

## 2012-03-15 LAB — AMYLASE: Amylase: 80 U/L (ref 25–115)

## 2012-03-15 LAB — COMPREHENSIVE METABOLIC PANEL
Albumin: 3.9 g/dL (ref 3.4–5.0)
Alkaline Phosphatase: 78 U/L (ref 50–136)
Anion Gap: 7 (ref 7–16)
BUN: 14 mg/dL (ref 7–18)
Bilirubin,Total: 0.6 mg/dL (ref 0.2–1.0)
Calcium, Total: 9.2 mg/dL (ref 8.5–10.1)
Chloride: 105 mmol/L (ref 98–107)
Co2: 29 mmol/L (ref 21–32)
Creatinine: 0.95 mg/dL (ref 0.60–1.30)
EGFR (African American): >60
EGFR (Non-African Amer.): >60
Glucose: 164 mg/dL — ABNORMAL HIGH (ref 65–99)
Osmolality: 285 (ref 275–301)
Potassium: 3.9 mmol/L (ref 3.5–5.1)
SGOT(AST): 21 U/L (ref 15–37)
SGPT (ALT): 36 U/L
Sodium: 141 mmol/L (ref 136–145)
Total Protein: 8.2 g/dL (ref 6.4–8.2)

## 2012-03-15 LAB — FOLATE: Folic Acid: 18.3 ng/mL (ref 3.1–100.0)

## 2012-03-15 LAB — CBC WITH DIFFERENTIAL/PLATELET
Basophil #: 0.1 10*3/uL (ref 0.0–0.1)
Basophil %: 2.2 %
Eosinophil #: 0.2 10*3/uL (ref 0.0–0.7)
Eosinophil %: 3.9 %
HCT: 36 % (ref 35.0–47.0)
HGB: 11.9 g/dL — ABNORMAL LOW (ref 12.0–16.0)
Lymphocyte #: 1.5 10*3/uL (ref 1.0–3.6)
Lymphocyte %: 28.3 %
MCH: 28.3 pg (ref 26.0–34.0)
MCHC: 33.2 g/dL (ref 32.0–36.0)
MCV: 85 fL (ref 80–100)
Monocyte #: 0.3 x10 3/mm (ref 0.2–0.9)
Monocyte %: 5.6 %
Neutrophil #: 3.1 10*3/uL (ref 1.4–6.5)
Neutrophil %: 60 %
Platelet: 193 10*3/uL (ref 150–440)
RBC: 4.22 10*6/uL (ref 3.80–5.20)
RDW: 14.3 % (ref 11.5–14.5)
WBC: 5.2 10*3/uL (ref 3.6–11.0)

## 2012-03-15 LAB — MAGNESIUM: Magnesium: 1.4 mg/dL — ABNORMAL LOW

## 2012-03-15 LAB — TSH: Thyroid Stimulating Horm: 3.44 u[IU]/mL

## 2012-03-15 LAB — PHOSPHORUS: Phosphorus: 3.3 mg/dL (ref 2.5–4.9)

## 2012-03-15 LAB — IRON AND TIBC
Iron Bind.Cap.(Total): 396 ug/dL (ref 250–450)
Iron Saturation: 16 %
Iron: 63 ug/dL (ref 50–170)
Unbound Iron-Bind.Cap.: 331 ug/dL

## 2012-03-15 LAB — LIPASE, BLOOD: Lipase: 175 U/L (ref 73–393)

## 2012-03-15 LAB — PROTIME-INR
INR: 1
Prothrombin Time: 13.8 secs (ref 11.5–14.7)

## 2012-03-15 LAB — FERRITIN: Ferritin (ARMC): 34 ng/mL (ref 8–388)

## 2012-03-15 LAB — HEMOGLOBIN A1C: Hemoglobin A1C: 8.5 % — ABNORMAL HIGH (ref 4.2–6.3)

## 2012-03-15 LAB — APTT: Activated PTT: 28.8 secs (ref 23.6–35.9)

## 2012-03-15 LAB — HEPATIC FUNCTION PANEL A (ARMC): Bilirubin, Direct: 0.1 mg/dL (ref 0.00–0.20)

## 2012-03-16 ENCOUNTER — Ambulatory Visit: Payer: Self-pay | Admitting: Bariatrics

## 2012-04-15 ENCOUNTER — Ambulatory Visit: Payer: Self-pay | Admitting: Bariatrics

## 2012-05-16 ENCOUNTER — Ambulatory Visit: Payer: Self-pay | Admitting: Bariatrics

## 2012-06-04 ENCOUNTER — Ambulatory Visit: Payer: Self-pay | Admitting: Cardiology

## 2012-06-24 ENCOUNTER — Ambulatory Visit: Payer: Self-pay | Admitting: Specialist

## 2012-07-09 ENCOUNTER — Ambulatory Visit: Payer: Self-pay | Admitting: Specialist

## 2012-07-09 LAB — BASIC METABOLIC PANEL
Anion Gap: 10 (ref 7–16)
BUN: 23 mg/dL — ABNORMAL HIGH (ref 7–18)
Calcium, Total: 9.3 mg/dL (ref 8.5–10.1)
Chloride: 100 mmol/L (ref 98–107)
Co2: 26 mmol/L (ref 21–32)
Creatinine: 0.99 mg/dL (ref 0.60–1.30)
EGFR (African American): >60
EGFR (Non-African Amer.): >60
Glucose: 331 mg/dL — ABNORMAL HIGH (ref 65–99)
Osmolality: 289 (ref 275–301)
Potassium: 3.7 mmol/L (ref 3.5–5.1)
Sodium: 136 mmol/L (ref 136–145)

## 2012-07-09 LAB — MAGNESIUM: Magnesium: 1.3 mg/dL — ABNORMAL LOW

## 2012-07-15 ENCOUNTER — Ambulatory Visit: Payer: Self-pay | Admitting: Internal Medicine

## 2012-07-16 ENCOUNTER — Ambulatory Visit: Payer: Self-pay | Admitting: Specialist

## 2012-07-23 ENCOUNTER — Inpatient Hospital Stay: Payer: Self-pay | Admitting: Specialist

## 2012-07-23 LAB — CBC WITH DIFFERENTIAL/PLATELET
Basophil #: 0 10*3/uL (ref 0.0–0.1)
Basophil %: 0.7 %
Eosinophil #: 0.1 10*3/uL (ref 0.0–0.7)
Eosinophil %: 2.9 %
HCT: 37.7 % (ref 35.0–47.0)
HGB: 13.1 g/dL (ref 12.0–16.0)
Lymphocyte #: 1.4 10*3/uL (ref 1.0–3.6)
Lymphocyte %: 28.1 %
MCH: 28.6 pg (ref 26.0–34.0)
MCHC: 34.9 g/dL (ref 32.0–36.0)
MCV: 82 fL (ref 80–100)
Monocyte #: 0.4 x10 3/mm (ref 0.2–0.9)
Monocyte %: 8.3 %
Neutrophil #: 3.1 10*3/uL (ref 1.4–6.5)
Neutrophil %: 60 %
Platelet: 227 10*3/uL (ref 150–440)
RBC: 4.59 10*6/uL (ref 3.80–5.20)
RDW: 14 % (ref 11.5–14.5)
WBC: 5.1 10*3/uL (ref 3.6–11.0)

## 2012-07-23 LAB — PROTIME-INR
INR: 1
Prothrombin Time: 14 secs (ref 11.5–14.7)

## 2012-07-24 LAB — CBC WITH DIFFERENTIAL/PLATELET
Basophil #: 0 10*3/uL (ref 0.0–0.1)
Basophil %: 0.1 %
Eosinophil #: 0 10*3/uL (ref 0.0–0.7)
Eosinophil %: 0 %
HCT: 37.7 % (ref 35.0–47.0)
HGB: 13 g/dL (ref 12.0–16.0)
Lymphocyte #: 0.6 10*3/uL — ABNORMAL LOW (ref 1.0–3.6)
Lymphocyte %: 6.8 %
MCH: 28.9 pg (ref 26.0–34.0)
MCHC: 34.6 g/dL (ref 32.0–36.0)
MCV: 84 fL (ref 80–100)
Monocyte #: 0.3 x10 3/mm (ref 0.2–0.9)
Monocyte %: 3.1 %
Neutrophil #: 8.6 10*3/uL — ABNORMAL HIGH (ref 1.4–6.5)
Neutrophil %: 90 %
Platelet: 215 10*3/uL (ref 150–440)
RBC: 4.51 10*6/uL (ref 3.80–5.20)
RDW: 13.9 % (ref 11.5–14.5)
WBC: 9.6 10*3/uL (ref 3.6–11.0)

## 2012-07-24 LAB — BASIC METABOLIC PANEL
Anion Gap: 11 (ref 7–16)
BUN: 10 mg/dL (ref 7–18)
Calcium, Total: 8.5 mg/dL (ref 8.5–10.1)
Chloride: 103 mmol/L (ref 98–107)
Co2: 24 mmol/L (ref 21–32)
Creatinine: 0.98 mg/dL (ref 0.60–1.30)
EGFR (African American): >60
EGFR (Non-African Amer.): >60
Glucose: 189 mg/dL — ABNORMAL HIGH (ref 65–99)
Osmolality: 280 (ref 275–301)
Potassium: 4.3 mmol/L (ref 3.5–5.1)
Sodium: 138 mmol/L (ref 136–145)

## 2012-07-24 LAB — PHOSPHORUS: Phosphorus: 2.9 mg/dL (ref 2.5–4.9)

## 2012-07-24 LAB — MAGNESIUM: Magnesium: 1.3 mg/dL — ABNORMAL LOW

## 2012-07-24 LAB — ALBUMIN: Albumin: 3.7 g/dL (ref 3.4–5.0)

## 2012-07-25 LAB — PATHOLOGY REPORT

## 2012-08-22 ENCOUNTER — Ambulatory Visit: Payer: Self-pay | Admitting: Specialist

## 2012-09-15 ENCOUNTER — Ambulatory Visit: Payer: Self-pay | Admitting: Specialist

## 2012-11-26 ENCOUNTER — Other Ambulatory Visit: Payer: Self-pay | Admitting: *Deleted

## 2012-11-26 MED ORDER — LISINOPRIL 10 MG PO TABS: 10.0000 mg | ORAL_TABLET | Freq: Every day | ORAL | Status: DC

## 2013-07-23 ENCOUNTER — Emergency Department (HOSPITAL_COMMUNITY)
Admission: EM | Admit: 2013-07-23 | Discharge: 2013-07-23 | Discharge status: Absent without leave | Payer: Commercial Managed Care - PPO | Source: Home / Self Care

## 2013-12-01 ENCOUNTER — Ambulatory Visit: Payer: Self-pay | Admitting: Internal Medicine

## 2013-12-08 ENCOUNTER — Ambulatory Visit (INDEPENDENT_AMBULATORY_CARE_PROVIDER_SITE_OTHER): Payer: 59 | Admitting: Adult Health

## 2013-12-08 ENCOUNTER — Encounter: Payer: Self-pay | Admitting: Adult Health

## 2013-12-08 ENCOUNTER — Other Ambulatory Visit (HOSPITAL_COMMUNITY)
Admission: RE | Admit: 2013-12-08 | Discharge: 2013-12-08 | Disposition: A | Discharge status: Home / Self Care | Payer: 59 | Source: Ambulatory Visit | Attending: Adult Health | Admitting: Adult Health

## 2013-12-08 VITALS — BP 146/100 | HR 76 | Ht 60.0 in | Wt 163.0 lb

## 2013-12-08 DIAGNOSIS — Z01419 Encounter for gynecological examination (general) (routine) without abnormal findings: Secondary | ICD-10-CM | POA: Insufficient documentation

## 2013-12-08 DIAGNOSIS — Z1151 Encounter for screening for human papillomavirus (HPV): Secondary | ICD-10-CM | POA: Insufficient documentation

## 2013-12-08 DIAGNOSIS — Z1212 Encounter for screening for malignant neoplasm of rectum: Secondary | ICD-10-CM

## 2013-12-08 LAB — HEMOCCULT GUIAC POC 1CARD (OFFICE): Fecal Occult Blood, POC: NEGATIVE

## 2013-12-08 NOTE — Patient Instructions (Signed)
Physical in 1 year Mammogram yearly Colonoscopy due Labs with PCP Get mole checked

## 2013-12-08 NOTE — Progress Notes (Signed)
Patient ID: Frances Miller, female   DOB: [birth year], 58 y.o.   MRN: [id number] History of Present Illness:  Frances Miller is a 58 year old black female, married in for a pap and physical.She is complaining of occasional hot flash, but doing OK.Last period about 3 years ago. She is working at [employer].  Current Medications, Allergies, Past Medical History, Past Surgical History, Family History and Social History were reviewed in [facility] electronic medical record.   Past Medical History  Diagnosis Date  . Hypertension   . Hyperlipidemia   . Diabetes mellitus   . Obesity   . MI (myocardial infarction)    Past Surgical History  Procedure Laterality Date  . Tubal ligation    . Cardiac catheterization    Current outpatient prescriptions:aspirin 81 MG tablet, Take 81 mg by mouth daily.  , Disp: , Rfl: ;  atorvastatin (LIPITOR) 80 MG tablet, Take 1 tablet (80 mg total) by mouth daily., Disp: 30 tablet, Rfl: 1;  carvedilol (COREG) 3.125 MG tablet, Take 1 tablet (3.125 mg total) by mouth 2 (two) times daily with a meal., Disp: 30 tablet, Rfl: 6 lisinopril (PRINIVIL,ZESTRIL) 10 MG tablet, Take 1 tablet (10 mg total) by mouth daily., Disp: 30 tablet, Rfl: 0;  prasugrel (EFFIENT) 10 MG TABS, Take 10 mg by mouth daily.  , Disp: , Rfl: ;  nitroGLYCERIN (NITROSTAT) 0.4 MG SL tablet, Place 0.4 mg under the tongue every 5 (five) minutes as needed.  , Disp: , Rfl:   Review of Systems: Patient denies any headaches, blurred vision, shortness of breath, chest pain, abdominal pain, problems with bowel movements, urination, or intercourse. No joint pain or mood swings.    Physical Exam:BP 146/100  Pulse 76  Ht 5' (1.524 m)  Wt 163 lb (73.936 kg)  BMI 31.83 kg/m2 General:  Well developed, well nourished, no acute distress Skin:  Warm and dry Neck:  Midline trachea, normal thyroid Lungs; Clear to auscultation bilaterally Breast:  No dominant palpable mass, retraction, or nipple discharge Cardiovascular:  Regular rate and rhythm Abdomen:  Soft, non tender, no hepatosplenomegaly, has black irregular mole in middle of back that peels Pelvic:  External genitalia is normal in appearance.  The vagina is normal in appearance. The cervix is smooth, pap with HPV performed.  Uterus is felt to be normal size, shape, and contour.  No                adnexal masses or tenderness noted. Rectal: Good sphincter tone, no polyps, or hemorrhoids felt.  Hemoccult negative. Extremities:  No swelling or varicosities noted Psych:  No mood changes, alert and cooperative, seems happy   Impression: Yearly gyn exam    Plan: Physical in 1 year, if HPV negative pap in 3 years Mammogram yearly Colonoscopy advised soon Labs with Dr Ouida SillsFagan, has appt in May

## 2014-08-17 ENCOUNTER — Encounter: Payer: Self-pay | Admitting: Adult Health

## 2015-02-02 NOTE — Op Note (Signed)
PATIENT NAME:  Frances Miller, Frances Miller DATE OF BIRTH:  01/09/1956  DATE OF PROCEDURE:  07/23/2012  PREOPERATIVE DIAGNOSES:  1. Morbid obesity.  2. Possible hiatal hernia.  3. Gallstones.   POSTOPERATIVE DIAGNOSES:  1. Morbid obesity.  2. Possible hiatal hernia.  3. Gallstones.   PROCEDURE:  1. Laparoscopic sleeve gastrectomy with hiatal hernia repair. 2. Laparoscopic cholecystectomy.   SURGEON: Primus BravoJon Alexei Doswell, MD   ASSISTANT: Mariella SaaSarah Stout PA   COMPLICATIONS: None.   FINDINGS: Moderate hiatal hernia.   SPECIMENS: Gallbladder and portion of stomach.   ESTIMATED BLOOD LOSS: None.   CLINICAL HISTORY: See History and Physical.    DETAILS OF PROCEDURE: The patient was taken to the operating room and placed on the operating room table, in the supine position, with appropriate monitors and supplemental oxygen being delivered.  Broad spectrum IV antibiotics were administered. The patient was placed under general anesthesia without incident.  The abdomen was prepped and draped in the usual sterile fashion.  Access was obtained using 5 mm Optical trocar. Pneumoperitoneum was established without difficulty. Multiple other ports were placed in preparation for sleeve gastrectomy. A liver retractor was placed without incident. The entire stomach was mobilized from 5 cm from the pylorus all the way up to the fundus, and the fundus was mobilized off the left crura as well completely freeing up the posterior portion of the stomach. Posterior attachments were taken down so the crura could be visualized from both sides.  At that point, everything was removed from the stomach and a 64[address] [place] Bougie was placed down into the antrum. An Echelon green load stapler was used to bisect the antrum on first fire starting approximately 5 to 6 cm from the pylorus. I then continued up along the Bougie using a blue load stapler with excellent affect with care not to get too close to the Bougie itself, with minimal  traction. This continued all the way up to the left crura. The excess stomach was placed on the side and the Bougie was removed and endoscopy showed no evidence of obstruction at that time.  The excess stomach was removed through the abdominal cavity, and the wounds were closed using 4-0 Vicryl and Dermabond.   ADDENDUM #1: Hiatal hernia was repaired in the usual fashion by mobilizing the posterior crura and reapproximating them using interrupted 0 Surgidac suture to a good effect. Intra-mediastinal dissection of the esophagus was performed to achieve an adequate length of intra-abdominal esophagus, and at completion of the hiatal hernia the stomach was well situated 2 cm below the hiatal hernia repair.   ADDENDUM #2: The gallbladder was mobilized by raising it anteriorly and then cephalad in the plane of vision. The cystic duct and cystic artery were dissected free from surrounding tissues. Clips were placed proximally and distally on the duct and it was divided sharply. The cystic artery was divided using the Harmonic scalpel, and this was all done to the view of safety. The gallbladder was moved out of the liver bed using a Harmonic scalpel, and that specimen as well as the portion of the stomach was sent for pathologic evaluation.  ____________________________ Primus BravoJon Leocadio Heal, MD jb:cbb D: 07/23/2012 15:25:36 ET T: 07/23/2012 16:28:04 ET JOB#: [id number]  cc: Primus BravoJon Maisley Hainsworth, MD, <Dictator>  Cletis AthensJON Marjean DonnaM Havier Deeb MD ELECTRONICALLY SIGNED 08/12/2012 20:39

## 2016-07-16 ENCOUNTER — Emergency Department (HOSPITAL_COMMUNITY)
Admission: EM | Admit: 2016-07-16 | Discharge: 2016-07-16 | Disposition: A | Discharge status: Home / Self Care | Payer: Self-pay | Attending: Emergency Medicine | Admitting: Emergency Medicine

## 2016-07-16 ENCOUNTER — Emergency Department (HOSPITAL_COMMUNITY): Payer: Self-pay

## 2016-07-16 ENCOUNTER — Encounter (HOSPITAL_COMMUNITY): Payer: Self-pay | Admitting: *Deleted

## 2016-07-16 DIAGNOSIS — S62002A Unspecified fracture of navicular [scaphoid] bone of left wrist, initial encounter for closed fracture: Secondary | ICD-10-CM

## 2016-07-16 DIAGNOSIS — Y929 Unspecified place or not applicable: Secondary | ICD-10-CM | POA: Insufficient documentation

## 2016-07-16 DIAGNOSIS — S62025A Nondisplaced fracture of middle third of navicular [scaphoid] bone of left wrist, initial encounter for closed fracture: Secondary | ICD-10-CM | POA: Insufficient documentation

## 2016-07-16 DIAGNOSIS — I10 Essential (primary) hypertension: Secondary | ICD-10-CM | POA: Insufficient documentation

## 2016-07-16 DIAGNOSIS — Z7982 Long term (current) use of aspirin: Secondary | ICD-10-CM | POA: Insufficient documentation

## 2016-07-16 DIAGNOSIS — Y939 Activity, unspecified: Secondary | ICD-10-CM | POA: Insufficient documentation

## 2016-07-16 DIAGNOSIS — Y999 Unspecified external cause status: Secondary | ICD-10-CM | POA: Insufficient documentation

## 2016-07-16 DIAGNOSIS — Z79899 Other long term (current) drug therapy: Secondary | ICD-10-CM | POA: Insufficient documentation

## 2016-07-16 DIAGNOSIS — W109XXA Fall (on) (from) unspecified stairs and steps, initial encounter: Secondary | ICD-10-CM | POA: Insufficient documentation

## 2016-07-16 MED ORDER — HYDROCODONE-ACETAMINOPHEN 5-325 MG PO TABS: 2.0000 | ORAL_TABLET | ORAL | 0 refills | Status: DC | PRN

## 2016-07-16 NOTE — ED Triage Notes (Signed)
Pt states she fell earlier and injured her left hand.

## 2016-07-16 NOTE — ED Provider Notes (Addendum)
[facility]PT Provider Note   CSN: [id number] Arrival date & time: 07/16/16  0406     History   Chief Complaint Chief Complaint  Patient presents with  . Hand Injury    HPI Frances Miller is a 60 y.o. female.  HPI Patient states that she thought there was only one step but there was too and she fell. Pain in her left wrist. No other complaints. She did not hit her head and no pain in her arms. No chest pain or trouble breathing. Does have a history of hypertension states she lost weight and stop taking her medicines. No shortness of breath. No numbness weakness. No headache. She is not on anticoagulation. Past Medical History:  Diagnosis Date  . Hyperlipidemia   . Hypertension   . MI (myocardial infarction)   . Obesity     Patient Active Problem List   Diagnosis Date Noted  . Hypercholesteremia 02/09/2011  . 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 Myocardial infarction of anterolateral wall (HCC) 01/23/2011    Past Surgical History:  Procedure Laterality Date  . CARDIAC CATHETERIZATION    . TUBAL LIGATION      OB History    Gravida Para Term Preterm AB Living   1 1       1    SAB TAB Ectopic Multiple Live Births           1       Home Medications    Prior to Admission medications   Medication Sig Start Date End Date Taking? Authorizing Provider  aspirin 81 MG tablet Take 81 mg by mouth daily.      Historical Provider, MD  atorvastatin (LIPITOR) 80 MG tablet Take 1 tablet (80 mg total) by mouth daily. 12/20/11   Tonny BollmanMichael Cooper, MD  carvedilol (COREG) 3.125 MG tablet Take 1 tablet (3.125 mg total) by mouth 2 (two) times daily with a meal. 09/05/11   Kathlen Brunswickobert M Rothbart, MD  HYDROcodone-acetaminophen (NORCO/VICODIN) 5-325 MG tablet Take 2 tablets by mouth every 4 (four) hours as needed. 07/16/16   Benjiman CoreNathan Teal Raben, MD  lisinopril (PRINIVIL,ZESTRIL) 10 MG tablet Take 1 tablet (10 mg total) by mouth daily. 11/26/12   Herby Abrahamhomas D  Stuckey, MD  nitroGLYCERIN (NITROSTAT) 0.4 MG SL tablet Place 0.4 mg under the tongue every 5 (five) minutes as needed.      Historical Provider, MD  prasugrel (EFFIENT) 10 MG TABS Take 10 mg by mouth daily.      Historical Provider, MD    Family History Family History  Problem Relation Age of Onset  . Stroke Father   . Heart attack Father [phone]  . Colon cancer Mother 1379  . Hypertension Sister   . Diabetes Sister   . Hypertension Brother   . Hypertension Sister   . Colon cancer Brother 50    unsure what type of cancer    Social History Social History  Substance Use Topics  . Smoking status: Never Smoker  . Smokeless tobacco: Never Used  . Alcohol use No     Allergies   Review of patient's allergies indicates no known allergies.   Review of Systems Review of Systems  Constitutional: Negative for fever.  Eyes: Negative for photophobia.  Respiratory: Negative for shortness of breath.   Cardiovascular: Negative for chest pain.  Gastrointestinal: Negative for abdominal pain.  Genitourinary: Negative for flank pain.  Musculoskeletal: Negative for back pain and neck pain.       Left wrist pain  and swelling  Skin: Negative for wound.  Neurological: Negative for weakness.  Psychiatric/Behavioral: Negative for confusion.     Physical Exam Updated Vital Signs BP (!) 200/101   Pulse 82   Temp 98 F (36.7 C) (Oral)   Resp 18   Ht 5' (1.524 m)   Wt 140 lb (63.5 kg)   SpO2 98%   BMI 27.34 kg/m   Physical Exam  Constitutional: She appears well-developed.  Eyes: Pupils are equal, round, and reactive to light.  Neck: Neck supple.  Cardiovascular: Normal rate.   Pulmonary/Chest: Effort normal.  Abdominal: Soft. There is no tenderness.  Musculoskeletal: She exhibits tenderness.  Tenderness and swelling with some deformity to the left wrist. Skin intact. Neurovascular intact in hand. Radial pulse intact. No tenderness over the elbow or shoulder.  Skin: Skin is warm.  Capillary refill takes less than 2 seconds.     ED Treatments / Results  Labs (all labs ordered are listed, but only abnormal results are displayed) Labs Reviewed - No data to display  EKG  EKG Interpretation None       Radiology Dg Wrist Complete Left  Result Date: 07/16/2016 CLINICAL DATA:  Left lateral wrist pain after a fall. EXAM: LEFT WRIST - COMPLETE 3+ VIEW COMPARISON:  None. FINDINGS: Vague linear lucency across the waist of scaphoid bone seen only on the scaphoid view. This could represent a nondisplaced fracture. Mild dorsal soft tissue swelling. Left wrist appears otherwise intact. IMPRESSION: Linear lucency suggesting nondisplaced waist of scaphoid fracture. Electronically Signed   By: Burman Nieves M.D.   On: 07/16/2016 05:12    Procedures Procedures (including critical care time)  Medications Ordered in ED Medications - No data to display   Initial Impression / Assessment and Plan / ED Course  I have reviewed the triage vital signs and the nursing notes.  Pertinent labs & imaging results that were available during my care of the patient were reviewed by me and considered in my medical decision making (see chart for details).  Clinical Course    Patient with fall and wrist injury. Has scaphoid fracture. Some spica placed. Also history of hypertension his been off her medicines. Blood pressure elevated here. Asymptomatic. No chest pain. Not related to fall. Will start back on her medications.  Final Clinical Impressions(s) / ED Diagnoses   Final diagnoses:  Scaphoid fracture of wrist, left, closed, initial encounter  Essential hypertension    New Prescriptions New Prescriptions   HYDROCODONE-ACETAMINOPHEN (NORCO/VICODIN) 5-325 MG TABLET    Take 2 tablets by mouth every 4 (four) hours as needed.     Benjiman Core, MD 07/16/16 1610    Benjiman Core, MD 07/16/16 [phone]

## 2016-07-16 NOTE — ED Notes (Signed)
EDP aware of pt's BP and went in to speak with her.

## 2016-07-17 MED FILL — Hydrocodone-Acetaminophen Tab 5-325 MG: ORAL | Qty: 6 | Status: AC

## 2016-07-31 ENCOUNTER — Ambulatory Visit (INDEPENDENT_AMBULATORY_CARE_PROVIDER_SITE_OTHER): Payer: BLUE CROSS/BLUE SHIELD | Admitting: Orthopedic Surgery

## 2016-07-31 ENCOUNTER — Encounter: Payer: Self-pay | Admitting: Orthopedic Surgery

## 2016-07-31 ENCOUNTER — Ambulatory Visit (INDEPENDENT_AMBULATORY_CARE_PROVIDER_SITE_OTHER): Payer: BLUE CROSS/BLUE SHIELD

## 2016-07-31 VITALS — BP 94/76 | HR 87 | Wt 139.0 lb

## 2016-07-31 DIAGNOSIS — S62002A Unspecified fracture of navicular [scaphoid] bone of left wrist, initial encounter for closed fracture: Secondary | ICD-10-CM | POA: Diagnosis not present

## 2016-07-31 NOTE — Progress Notes (Signed)
Chief Complaint  Patient presents with  . Wrist Injury    left wrist scaphoid fracture,DOI 07/16/16   HPI   60 years old 15 days post injury to the left wrist with a mid waist scaphoid fracture.  Dull ache mild to moderate 15 days constant  Repeat x-ray will be done to assess the fracture  Review of Systems  Musculoskeletal: Positive for joint pain.  All other systems reviewed and are negative.   Past Medical History:  Diagnosis Date  . Hyperlipidemia   . Hypertension   . MI (myocardial infarction)   . Obesity     Past Surgical History:  Procedure Laterality Date  . CARDIAC CATHETERIZATION    . TUBAL LIGATION     Family History  Problem Relation Age of Onset  . Stroke Father   . Heart attack Father [phone]  . Colon cancer Mother [id number]  . Hypertension Sister   . Diabetes Sister   . Hypertension Brother   . Hypertension Sister   . Colon cancer Brother 50    unsure what type of cancer   Social History  Substance Use Topics  . Smoking status: Never Smoker  . Smokeless tobacco: Never Used  . Alcohol use No   Current Meds  Medication Sig  . AMLODIPINE BESYLATE PO Take by mouth.  Marland Kitchen. lisinopril (PRINIVIL,ZESTRIL) 10 MG tablet Take 1 tablet (10 mg total) by mouth daily.    BP 94/76   Pulse 87   Wt 139 lb (63 kg)   BMI 27.15 kg/m   Physical Exam  Constitutional: She is oriented to person, place, and time. She appears well-developed and well-nourished. No distress.  Cardiovascular: Normal rate and intact distal pulses.   Neurological: She is alert and oriented to person, place, and time. She has normal reflexes. She exhibits normal muscle tone. Coordination normal.  Skin: Skin is warm and dry. No rash noted. She is not diaphoretic. No erythema. No pallor.  Psychiatric: She has a normal mood and affect. Her behavior is normal. Judgment and thought content normal.    Ortho Exam   ASSESSMENT: My personal interpretation of the images:  4 views of the left wrist  including scaphoid view show a mid waist scaphoid fracture with no tilting of the lunate. The fracture appears nondisplaced only seen on the scaphoid view  Repeat films  Show nondisplaced mid waist scaphoid fracture   PLAN Short arm cast applied with thumb  X-ray 2 weeks in cast  Frances CanadaStanley Pandora Mccrackin, MD 07/31/2016 2:58 PM

## 2016-08-06 ENCOUNTER — Emergency Department (HOSPITAL_COMMUNITY)
Admission: EM | Admit: 2016-08-06 | Discharge: 2016-08-06 | Disposition: A | Discharge status: Home / Self Care | Payer: BLUE CROSS/BLUE SHIELD | Attending: Emergency Medicine | Admitting: Emergency Medicine

## 2016-08-06 ENCOUNTER — Encounter (HOSPITAL_COMMUNITY): Payer: Self-pay | Admitting: Emergency Medicine

## 2016-08-06 ENCOUNTER — Emergency Department (HOSPITAL_COMMUNITY)
Admission: EM | Admit: 2016-08-06 | Discharge: 2016-08-06 | Disposition: A | Discharge status: Home / Self Care | Payer: BLUE CROSS/BLUE SHIELD | Source: Home / Self Care | Attending: Emergency Medicine | Admitting: Emergency Medicine

## 2016-08-06 DIAGNOSIS — I10 Essential (primary) hypertension: Secondary | ICD-10-CM | POA: Insufficient documentation

## 2016-08-06 DIAGNOSIS — Z4689 Encounter for fitting and adjustment of other specified devices: Secondary | ICD-10-CM

## 2016-08-06 DIAGNOSIS — Z79899 Other long term (current) drug therapy: Secondary | ICD-10-CM | POA: Insufficient documentation

## 2016-08-06 DIAGNOSIS — Z4789 Encounter for other orthopedic aftercare: Secondary | ICD-10-CM | POA: Diagnosis not present

## 2016-08-06 DIAGNOSIS — S62002D Unspecified fracture of navicular [scaphoid] bone of left wrist, subsequent encounter for fracture with routine healing: Secondary | ICD-10-CM | POA: Diagnosis not present

## 2016-08-06 DIAGNOSIS — I2510 Atherosclerotic heart disease of native coronary artery without angina pectoris: Secondary | ICD-10-CM | POA: Insufficient documentation

## 2016-08-06 DIAGNOSIS — M25532 Pain in left wrist: Secondary | ICD-10-CM | POA: Diagnosis present

## 2016-08-06 DIAGNOSIS — Z791 Long term (current) use of non-steroidal anti-inflammatories (NSAID): Secondary | ICD-10-CM | POA: Insufficient documentation

## 2016-08-06 DIAGNOSIS — X58XXXD Exposure to other specified factors, subsequent encounter: Secondary | ICD-10-CM | POA: Insufficient documentation

## 2016-08-06 NOTE — Discharge Instructions (Signed)
Call Dr. Romeo AppleHarrison on Monday to set up close follow-up.  Please keep arm elevated at rest. Take pain medications as needed.  Return for worsening symptoms, including escalating pain despite pain medications, numbness/weakness of fingers, discoloration of the fingers with cast, or any other symptoms concerning to you.

## 2016-08-06 NOTE — ED Provider Notes (Signed)
[facility] Provider Note   CSN: [id number] Arrival date & time: 08/06/16  4098     History   Chief Complaint Chief Complaint  Patient presents with  . Cast Problem    HPI Frances Miller is a 60 y.o. female.  HPI  60 year old female who presents with cast re-evaluation. History of HTN, HLD, CAD. Had mechanical fall 10/1 with left scaphoid fracture. Seen by Dr. Romeo Apple 10/16 and placed in cast. States that since onset of cast, intermittent swelling of the fingers of the left hand and pain. Works at [employer], typing a lot. States at end of day, swelling is the worst.  Has been taking home Norco sparingly. No numbness or weakness of fingers. Has not been keeping arm elevated much at rest. Came to ED requesting cast removal vs revision.  Past Medical History:  Diagnosis Date  . Hyperlipidemia   . Hypertension   . MI (myocardial infarction)   . Obesity     Patient Active Problem List   Diagnosis Date Noted  . Hypercholesteremia 02/09/2011  . 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 Myocardial infarction of anterolateral wall (HCC) 01/23/2011    Past Surgical History:  Procedure Laterality Date  . CARDIAC CATHETERIZATION    . TUBAL LIGATION      OB History    Gravida Para Term Preterm AB Living   1 1       1    SAB TAB Ectopic Multiple Live Births           1       Home Medications    Prior to Admission medications   Medication Sig Start Date End Date Taking? Authorizing Provider  AMLODIPINE BESYLATE PO Take by mouth.    Historical Provider, MD  lisinopril (PRINIVIL,ZESTRIL) 10 MG tablet Take 1 tablet (10 mg total) by mouth daily. 11/26/12   Herby Abraham, MD    Family History Family History  Problem Relation Age of Onset  . Stroke Father   . Heart attack Father 60  . Colon cancer Mother 35  . Hypertension Sister   . Diabetes Sister   . Hypertension Brother   . Hypertension Sister   . Colon cancer Brother 50      unsure what type of cancer    Social History Social History  Substance Use Topics  . Smoking status: Never Smoker  . Smokeless tobacco: Never Used  . Alcohol use No     Allergies   Review of patient's allergies indicates no known allergies.   Review of Systems Review of Systems  Musculoskeletal: Positive for arthralgias (left wrist pain).  Allergic/Immunologic: Negative for immunocompromised state.  Neurological: Negative for weakness and numbness.  Hematological: Does not bruise/bleed easily.  All other systems reviewed and are negative.      Physical Exam Updated Vital Signs BP 197/98 (BP Location: Right Arm)   Pulse 74   Temp 97.7 F (36.5 C) (Oral)   Resp 16   Ht 5' (1.524 m)   Wt 139 lb (63 kg)   SpO2 99%   BMI 27.15 kg/m   Physical Exam Physical Exam  Nursing note and vitals reviewed. Constitutional: Well developed, well nourished, non-toxic, and in no acute distress Head: Normocephalic and atraumatic.  Mouth/Throat: Oropharynx is clear and moist.  Neck: Normal range of motion. Neck supple.  Cardiovascular: Normal rate and regular rhythm.   Pulmonary/Chest: Effort normal and breath sounds normal.  Abdominal: Soft. There is no tenderness. There  is no rebound and no guarding.  Musculoskeletal: Left wrist in thumb spica cast, soft compartments of the forearm, soft tissue swelling of the distal digits Neurological: Alert, no facial droop, fluent speech, moves all extremities symmetrically, sensation to light touch in tact in distal digits of the left hand, full strength DIP flexion, finger abduction, and opposition with the thumb Skin: Skin is warm and dry. Normal capillary refill of the distal digits of the left hand Psychiatric: Cooperative   ED Treatments / Results  Labs (all labs ordered are listed, but only abnormal results are displayed) Labs Reviewed - No data to display  EKG  EKG Interpretation None       Radiology No results  found.  Procedures Procedures (including critical care time)  Medications Ordered in ED Medications - No data to display   Initial Impression / Assessment and Plan / ED Course  I have reviewed the triage vital signs and the nursing notes.  Pertinent labs & imaging results that were available during my care of the patient were reviewed by me and considered in my medical decision making (see chart for details).  Clinical Course   Presenting with wrist pain and distal finger swelling of left hand after cast placement. No concern for compartment syndrome. No pain out of proportion, has only needed pain medications sparing. With some soft tissue swelling of the distal fingers, but neurovascularly in tact. Compartments are soft. Discussed with Dr. Romeo AppleHarrison, who was willing to have us make small incisions to the distal and proximal ends of the cast. She will follow-up this week in clinic with him.  Strict return and follow-up instructions reviewed. She expressed understanding of all discharge instructions and felt comfortable with the plan of care.   Final Clinical Impressions(s) / ED Diagnoses   Final diagnoses:  Cast discomfort  Tight cast    New Prescriptions New Prescriptions   No medications on file     Lavera Guiseana Duo Crystalee Ventress, MD 08/06/16 [phone]

## 2016-08-06 NOTE — ED Notes (Signed)
Cast split by ED staff.

## 2016-08-06 NOTE — ED Provider Notes (Signed)
[facility]PT Provider Note   CSN: [id number] Arrival date & time: 08/06/16  1621     History   Chief Complaint Chief Complaint  Patient presents with  . Arm Pain    HPI Frances Miller is a 60 y.o. female.  Patient is a 60 year old female who presents to the emergency department with a complaint of left arm pain and problem with cast.  The patient states that at the beginning of October she sustained a fracture of her wrist. In particular her scaphoid bone. She was seen by orthopedics on October 16, and placed in a cast on. She states she's been having some pain and problem since that time, but today she noticed pain and swelling with some tingling of her fingers on the left upper extremity. She presented to the emergency department earlier during the morning she had an incision made on the cast to hopefully relieve pressure. She states she went home, took a hydrocodone tablet, went to sleep, but when she awakened she still had pain and tingling sensation involving the left arm and fingers. She presents to the emergency department now for evaluation and for assistance with this problem.    Arm Pain     Past Medical History:  Diagnosis Date  . Hyperlipidemia   . Hypertension   . MI (myocardial infarction)   . Obesity     Patient Active Problem List   Diagnosis Date Noted  . Hypercholesteremia 02/09/2011  . 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 Myocardial infarction of anterolateral wall (HCC) 01/23/2011    Past Surgical History:  Procedure Laterality Date  . CARDIAC CATHETERIZATION    . TUBAL LIGATION      OB History    Gravida Para Term Preterm AB Living   1 1       1    SAB TAB Ectopic Multiple Live Births           1       Home Medications    Prior to Admission medications   Medication Sig Start Date End Date Taking? Authorizing Provider  amLODipine (NORVASC) 10 MG tablet Take 10 mg by mouth daily.   Yes  Historical Provider, MD  ibuprofen (ADVIL,MOTRIN) 200 MG tablet Take 200 mg by mouth every 6 (six) hours as needed for mild pain or moderate pain.   Yes Historical Provider, MD  lisinopril (PRINIVIL,ZESTRIL) 10 MG tablet Take 1 tablet (10 mg total) by mouth daily. 11/26/12  Yes Herby Abraham, MD    Family History Family History  Problem Relation Age of Onset  . Stroke Father   . Heart attack Father 31  . Colon cancer Mother 38  . Hypertension Sister   . Diabetes Sister   . Hypertension Brother   . Hypertension Sister   . Colon cancer Brother 50    unsure what type of cancer    Social History Social History  Substance Use Topics  . Smoking status: Never Smoker  . Smokeless tobacco: Never Used  . Alcohol use No     Allergies   Review of patient's allergies indicates no known allergies.   Review of Systems Review of Systems  Musculoskeletal: Positive for arthralgias.  All other systems reviewed and are negative.    Physical Exam Updated Vital Signs BP 177/92 (BP Location: Right Arm)   Pulse 83   Temp 97.8 F (36.6 C) (Oral)   Resp 18   Ht 5' (1.524 m)   Wt 63 kg  SpO2 100%   BMI 27.15 kg/m   Physical Exam  Constitutional: She is oriented to person, place, and time. She appears well-developed and well-nourished.  Non-toxic appearance.  HENT:  Head: Normocephalic.  Right Ear: Tympanic membrane and external ear normal.  Left Ear: Tympanic membrane and external ear normal.  Eyes: EOM and lids are normal. Pupils are equal, round, and reactive to light.  Neck: Normal range of motion. Neck supple. Carotid bruit is not present.  Cardiovascular: Normal rate, regular rhythm, normal heart sounds, intact distal pulses and normal pulses.   Pulmonary/Chest: Breath sounds normal. No respiratory distress.  Abdominal: Soft. Bowel sounds are normal. There is no tenderness. There is no guarding.  Musculoskeletal: Normal range of motion.  There is full range of motion of  the left shoulder and elbow. There is a pink  thumb spica cast on the left upper extremity. The fingers are freely movable. The capillary refill is less than 2 seconds, and there is no temperature difference between the left fingers and the right fingers. The patient indicates pain at the ulnar area of the wrist and the posterior portion of the splint at the forearm area. Compartments appear to be soft tissue limited evaluation.  Lymphadenopathy:       Head (right side): No submandibular adenopathy present.       Head (left side): No submandibular adenopathy present.    She has no cervical adenopathy.  Neurological: She is alert and oriented to person, place, and time. She has normal strength. No cranial nerve deficit or sensory deficit.  Skin: Skin is warm and dry.  Psychiatric: She has a normal mood and affect. Her speech is normal.  Nursing note and vitals reviewed.    ED Treatments / Results  Labs (all labs ordered are listed, but only abnormal results are displayed) Labs Reviewed - No data to display  EKG  EKG Interpretation None       Radiology No results found.  Procedures .Splint Application Date/Time: 08/06/2016 5:46 PM Performed by: Ivery Quale Authorized by: Ivery Quale   Consent:    Consent obtained:  Verbal   Consent given by:  Patient   Risks discussed:  Pain and swelling   Alternatives discussed:  Referral Pre-procedure details:    Sensation:  Normal   Skin color:  Cap refill less than 2 sec. Procedure details:    Laterality:  Left   Location:  Wrist   Wrist:  L wrist   Cast type: Removed Thumb spica splint with cast cutter/splitter.   Splint type:  Thumb spica   Supplies:  Elastic bandage Post-procedure details:    Pain:  Improved   Sensation:  Normal   Skin color:  Normal   Patient tolerance of procedure:  Tolerated well, no immediate complications   (including critical care time)  Medications Ordered in ED Medications - No data to  display   Initial Impression / Assessment and Plan / ED Course  I have reviewed the triage vital signs and the nursing notes.  Pertinent labs & imaging results that were available during my care of the patient were reviewed by me and considered in my medical decision making (see chart for details).  Clinical Course    **I have reviewed nursing notes, vital signs, and all appropriate lab and imaging results for this patient.*  Final Clinical Impressions(s) / ED Diagnoses  Blood pressure is elevated at 177/92, but patient states she has not taken one of her blood pressure pills today.  I initially splint the thumb spica cast and spread the area, and was able to place my finger under the cast after had been split. The patient states however that in those same 2 areas that she complained about upon admission she was still having great deal of pain. Patient request the cast be removed.  I called and spoke with Dr. Romeo AppleHarrison. Case was discussed in detail, including the history of the patient being in the emergency department earlier this morning. Dr. Romeo AppleHarrison felt that we should remove the cast, and placed the patient in the thumb spica Velcro splint. I discussed with the patient that removing the cast in place and the patient in a splint instead of the molded and mottled casts could contribute to malunion of the scaphoid bone fracture. The patient continues to ask that the cast be removed.  Thumb spica cast removed without problem. The hand and wrist was cleansed. A thumb supporting Ace wrap was applied and then the thumb spica splint was applied. The patient states this feels much better. I strongly encouraged patient to see Dr. Romeo AppleHarrison this week for reassessment and management. Patient is in agreement with this plan.    Final diagnoses:  None    New Prescriptions New Prescriptions   No medications on file     Ivery QualeHobson Coy Vandoren, PA-C 08/06/16 1756    Loren Raceravid Yelverton, MD 08/11/16 [id number]

## 2016-08-06 NOTE — ED Notes (Signed)
H. Bryant, PA at bedside. 

## 2016-08-06 NOTE — ED Triage Notes (Signed)
Patient complains of swelling to left hand cast at fingers. States pain at wrist when hand swells.

## 2016-08-06 NOTE — Discharge Instructions (Signed)
As requested, the cast has been removed. A splint has been applied. It is very important that she see Dr. Romeo AppleHarrison in the office for recheck and additional management of this scaphoid fracture of your wrist. Keep your hand elevated as much as possible on. Continue your current medications.

## 2016-08-06 NOTE — ED Triage Notes (Signed)
Pt seen here this am for pain to her L forearm. Cast was cut to relieve pressure. Pt back this afternoon stating her pain is no better.

## 2016-08-06 NOTE — ED Notes (Signed)
Pt complain of swelling around cast, especially around wrist and thumb. Pt states the swelling has gone down now..Marland Kitchen

## 2016-08-06 NOTE — ED Notes (Signed)
Loney LaurenceH. Bryant, PA in room with cast cutter

## 2016-08-06 NOTE — ED Triage Notes (Signed)
Pt called for Triage, no answer. 

## 2016-08-14 ENCOUNTER — Ambulatory Visit (INDEPENDENT_AMBULATORY_CARE_PROVIDER_SITE_OTHER): Payer: BLUE CROSS/BLUE SHIELD | Admitting: Orthopedic Surgery

## 2016-08-14 ENCOUNTER — Ambulatory Visit (INDEPENDENT_AMBULATORY_CARE_PROVIDER_SITE_OTHER): Payer: BLUE CROSS/BLUE SHIELD

## 2016-08-14 DIAGNOSIS — S62002D Unspecified fracture of navicular [scaphoid] bone of left wrist, subsequent encounter for fracture with routine healing: Secondary | ICD-10-CM

## 2016-08-14 NOTE — Progress Notes (Signed)
Patient ID: Frances Miller, female   DOB: 12-28-1955, 60 y.o.   MRN: [id number]  Post op visit   Chief Complaint  Patient presents with  . Follow-up    left Scaphoid fracture: 07/16/2016 date of injury     Fracture care follow-up scaphoid fracture  Patient went to the emergency room on 2 occasions since her last visit because of difficulties with her cast. We decided to take her out of the cast and put her in a splint as long as she understands risks of doing so    4- total views of the wrist on the left  Patient is a mid waist scaphoid fracture  This is a fracture x-ray follow-up  X-rays [facility] lateral oblique and scaphoid views show nondisplaced fracture healing well.  Impression nondisplaced scaphoid fracture left wrist  Recommend x-ray around December 1  Continue splint  Risks clearly explained and understood by the patient

## 2016-08-14 NOTE — Patient Instructions (Signed)
Again not casting this places you at increased risk for nonunion with its inherent complications including potential need for wrist fusion

## 2016-09-15 ENCOUNTER — Ambulatory Visit: Payer: BLUE CROSS/BLUE SHIELD | Admitting: Orthopedic Surgery

## 2016-09-25 ENCOUNTER — Ambulatory Visit: Payer: BLUE CROSS/BLUE SHIELD | Admitting: Orthopedic Surgery

## 2016-10-02 ENCOUNTER — Ambulatory Visit: Payer: BLUE CROSS/BLUE SHIELD

## 2016-10-02 ENCOUNTER — Encounter: Payer: Self-pay | Admitting: Orthopedic Surgery

## 2016-10-02 ENCOUNTER — Ambulatory Visit (INDEPENDENT_AMBULATORY_CARE_PROVIDER_SITE_OTHER): Payer: BLUE CROSS/BLUE SHIELD | Admitting: Orthopedic Surgery

## 2016-10-02 ENCOUNTER — Ambulatory Visit (INDEPENDENT_AMBULATORY_CARE_PROVIDER_SITE_OTHER): Payer: BLUE CROSS/BLUE SHIELD

## 2016-10-02 DIAGNOSIS — S62002D Unspecified fracture of navicular [scaphoid] bone of left wrist, subsequent encounter for fracture with routine healing: Secondary | ICD-10-CM

## 2016-10-02 NOTE — Progress Notes (Signed)
Fracture care follow-up left scaphoid fracture treated conservatively  X-rays show that the fracture is healed clinical exam shows no tenderness or motion loss  Fracture occurred on or about October 1  Patient released follow-up as needed

## 2018-07-21 ENCOUNTER — Emergency Department (HOSPITAL_COMMUNITY)
Admission: EM | Admit: 2018-07-21 | Discharge: 2018-07-21 | Disposition: A | Discharge status: Home / Self Care | Payer: BLUE CROSS/BLUE SHIELD | Attending: Emergency Medicine | Admitting: Emergency Medicine

## 2018-07-21 ENCOUNTER — Encounter (HOSPITAL_COMMUNITY): Payer: Self-pay | Admitting: *Deleted

## 2018-07-21 DIAGNOSIS — I10 Essential (primary) hypertension: Secondary | ICD-10-CM | POA: Insufficient documentation

## 2018-07-21 DIAGNOSIS — R609 Edema, unspecified: Secondary | ICD-10-CM

## 2018-07-21 DIAGNOSIS — R590 Localized enlarged lymph nodes: Secondary | ICD-10-CM | POA: Diagnosis not present

## 2018-07-21 DIAGNOSIS — E785 Hyperlipidemia, unspecified: Secondary | ICD-10-CM | POA: Diagnosis not present

## 2018-07-21 DIAGNOSIS — I252 Old myocardial infarction: Secondary | ICD-10-CM | POA: Diagnosis not present

## 2018-07-21 DIAGNOSIS — R221 Localized swelling, mass and lump, neck: Secondary | ICD-10-CM | POA: Diagnosis present

## 2018-07-21 MED ORDER — AMLODIPINE BESYLATE 5 MG PO TABS
10.0000 mg | ORAL_TABLET | Freq: Once | ORAL | Status: AC
  Administered 2018-07-21: 10 mg via ORAL
  Filled 2018-07-21: qty 2

## 2018-07-21 MED ORDER — LISINOPRIL 10 MG PO TABS: 10.0000 mg | ORAL_TABLET | Freq: Every day | ORAL | 0 refills | Status: AC

## 2018-07-21 MED ORDER — LISINOPRIL 5 MG PO TABS
5.0000 mg | ORAL_TABLET | Freq: Once | ORAL | Status: AC
  Administered 2018-07-21: 5 mg via ORAL
  Filled 2018-07-21: qty 1

## 2018-07-21 MED ORDER — AMLODIPINE BESYLATE 10 MG PO TABS: 10.0000 mg | ORAL_TABLET | Freq: Every day | ORAL | 0 refills | Status: AC

## 2018-07-21 NOTE — ED Provider Notes (Signed)
Emergency Department Provider Note   I have reviewed the triage vital signs and the nursing notes.   HISTORY  Chief Complaint Abscess and Hypertension   HPI Frances Miller is a 62 y.o. female who presents the emergency department today secondary to swelling to the right side of her neck.  Patient states that started earlier today and seems to be progressively worsening she did some salt water washes which made it better but then it came back.  Not painful.  She has no mouth pain.  No ear pain.  Not I recognize this before.  No history of autoimmune diseases. After seeing her blood pressure was high I discussed with her mother not she had any chest pain, shortness of breath, headache, vision changes, lower extremity edema, decreased urine output or neurologic symptoms and she denied all of these. No other associated or modifying symptoms.    Past Medical History:  Diagnosis Date  . Hyperlipidemia   . Hypertension   . MI (myocardial infarction) (HCC)   . Obesity     Patient Active Problem List   Diagnosis Date Noted  . Hypercholesteremia 02/09/2011  . 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 Myocardial infarction of anterolateral wall (HCC) 01/23/2011    Past Surgical History:  Procedure Laterality Date  . CARDIAC CATHETERIZATION    . TUBAL LIGATION      Current Outpatient Rx  . Order #: [id number] Class: Print  . Order #: [id number] Class: Print    Allergies Patient has no known allergies.  Family History  Problem Relation Age of Onset  . Stroke Father   . Heart attack Father 59  . Colon cancer Mother 71  . Hypertension Sister   . Diabetes Sister   . Hypertension Brother   . Hypertension Sister   . Colon cancer Brother 50       unsure what type of cancer    Social History Social History   Tobacco Use  . Smoking status: Never Smoker  . Smokeless tobacco: Never Used  Substance Use Topics  . Alcohol use: No  . Drug use:  No    Review of Systems  All other systems negative except as documented in the HPI. All pertinent positives and negatives as reviewed in the HPI. ____________________________________________   PHYSICAL EXAM:  VITAL SIGNS: ED Triage Vitals  Enc Vitals Group     BP 07/21/18 1825 (!) 242/111     Pulse Rate 07/21/18 1825 80     Resp 07/21/18 1825 18     Temp 07/21/18 1825 97.7 F (36.5 C)     Temp Source 07/21/18 1825 Oral     SpO2 07/21/18 1825 98 %     Weight 07/21/18 1824 130 lb (59 kg)     Height 07/21/18 1824 5' (1.524 m)     Head Circumference --      Peak Flow --      Pain Score 07/21/18 1823 0     Pain Loc --      Pain Edu? --      Excl. in GC? --     Constitutional: Alert and oriented. Well appearing and in no acute distress. Eyes: Conjunctivae are normal. PERRL. EOMI. Head: Atraumatic. Nose: No congestion/rhinnorhea. Mouth/Throat: Mucous membranes are moist.  Oropharynx non-erythematous. Dental caries but no obvious infection or abscess.  Neck: No stridor.  No meningeal signs.  Swelling firm area under right posteiror mandible. Non tender, no erythema or warmth. Cardiovascular: Normal rate, regular rhythm.  Good peripheral circulation. Grossly normal heart sounds.   Respiratory: Normal respiratory effort.  No retractions. Lungs CTAB. Gastrointestinal: Soft and nontender. No distention.  Musculoskeletal: No lower extremity tenderness nor edema. No gross deformities of extremities. Neurologic:  Normal speech and language. No gross focal neurologic deficits are appreciated.  Skin:  Skin is warm, dry and intact. No rash noted.  ____________________________________________   INITIAL IMPRESSION / ASSESSMENT AND PLAN / ED COURSE  Asymptomatic hypertension.  She has been off her medication for a few years for unknown reasons.  She is willing to restart this.  Started 2 out of the 3.  She will follow-up with her primary doctor to get rechecked and reevaluated. I feel  like the patient has likely sialolithiasis of the submandibular gland.  Encouraged sialagogues and ENT follow-up if not improving.  No evidence of infection at this time.  Warm compresses and anti-inflammatories     Pertinent labs & imaging results that were available during my care of the patient were reviewed by me and considered in my medical decision making (see chart for details).  ____________________________________________  FINAL CLINICAL IMPRESSION(S) / ED DIAGNOSES  Final diagnoses:  Hypertension, unspecified type  Submandibular gland swelling     MEDICATIONS GIVEN DURING THIS VISIT:  Medications  amLODipine (NORVASC) tablet 10 mg (10 mg Oral Given 07/21/18 1909)  lisinopril (PRINIVIL,ZESTRIL) tablet 5 mg (5 mg Oral Given 07/21/18 1909)     NEW OUTPATIENT MEDICATIONS STARTED DURING THIS VISIT:  Discharge Medication List as of 07/21/2018  7:53 PM    START taking these medications   Details  amLODipine (NORVASC) 10 MG tablet Take 1 tablet (10 mg total) by mouth daily., Starting Sun 07/21/2018, Print    lisinopril (PRINIVIL,ZESTRIL) 10 MG tablet Take 1 tablet (10 mg total) by mouth daily., Starting Sun 07/21/2018, Print        Note:  This note was prepared with assistance of Dragon voice recognition software. Occasional wrong-word or sound-a-like substitutions may have occurred due to the inherent limitations of voice recognition software.   Marily Memos, MD 07/21/18 2144

## 2018-07-21 NOTE — ED Triage Notes (Signed)
Pt with abscess to right side neck for past 4 hours, pt denies any dental pain. Denies difficultly breathing and mild pain with swallowing.

## 2018-12-14 ENCOUNTER — Emergency Department (HOSPITAL_COMMUNITY): Payer: BLUE CROSS/BLUE SHIELD

## 2018-12-14 ENCOUNTER — Other Ambulatory Visit: Payer: Self-pay

## 2018-12-14 ENCOUNTER — Encounter (HOSPITAL_COMMUNITY): Payer: Self-pay

## 2018-12-14 ENCOUNTER — Emergency Department (HOSPITAL_COMMUNITY)
Admission: EM | Admit: 2018-12-14 | Discharge: 2018-12-14 | Disposition: A | Discharge status: Home / Self Care | Payer: BLUE CROSS/BLUE SHIELD | Attending: Emergency Medicine | Admitting: Emergency Medicine

## 2018-12-14 DIAGNOSIS — Z79899 Other long term (current) drug therapy: Secondary | ICD-10-CM | POA: Diagnosis not present

## 2018-12-14 DIAGNOSIS — M79605 Pain in left leg: Secondary | ICD-10-CM | POA: Insufficient documentation

## 2018-12-14 DIAGNOSIS — I10 Essential (primary) hypertension: Secondary | ICD-10-CM | POA: Diagnosis not present

## 2018-12-14 DIAGNOSIS — I252 Old myocardial infarction: Secondary | ICD-10-CM | POA: Diagnosis not present

## 2018-12-14 DIAGNOSIS — M7989 Other specified soft tissue disorders: Secondary | ICD-10-CM | POA: Diagnosis present

## 2018-12-14 DIAGNOSIS — M79604 Pain in right leg: Secondary | ICD-10-CM

## 2018-12-14 DIAGNOSIS — J189 Pneumonia, unspecified organism: Secondary | ICD-10-CM | POA: Diagnosis not present

## 2018-12-14 LAB — CBC WITH DIFFERENTIAL/PLATELET
Abs Immature Granulocytes: 0.14 10*3/uL — ABNORMAL HIGH (ref 0.00–0.07)
Basophils Absolute: 0.1 10*3/uL (ref 0.0–0.1)
Basophils Relative: 0 %
Eosinophils Absolute: 0.1 10*3/uL (ref 0.0–0.5)
Eosinophils Relative: 0 %
HCT: 29.3 % — ABNORMAL LOW (ref 36.0–46.0)
Hemoglobin: 8.8 g/dL — ABNORMAL LOW (ref 12.0–15.0)
Immature Granulocytes: 1 %
Lymphocytes Relative: 6 %
Lymphs Abs: 1 10*3/uL (ref 0.7–4.0)
MCH: 27.4 pg (ref 26.0–34.0)
MCHC: 30 g/dL (ref 30.0–36.0)
MCV: 91.3 fL (ref 80.0–100.0)
Monocytes Absolute: 0.8 10*3/uL (ref 0.1–1.0)
Monocytes Relative: 5 %
Neutro Abs: 13.9 10*3/uL — ABNORMAL HIGH (ref 1.7–7.7)
Neutrophils Relative %: 88 %
Platelets: 383 10*3/uL (ref 150–400)
RBC: 3.21 MIL/uL — ABNORMAL LOW (ref 3.87–5.11)
RDW: 13.1 % (ref 11.5–15.5)
WBC: 16 10*3/uL — ABNORMAL HIGH (ref 4.0–10.5)
nRBC: 0 % (ref 0.0–0.2)

## 2018-12-14 LAB — BASIC METABOLIC PANEL
Anion gap: 10 (ref 5–15)
BUN: 15 mg/dL (ref 8–23)
CO2: 27 mmol/L (ref 22–32)
Calcium: 8.8 mg/dL — ABNORMAL LOW (ref 8.9–10.3)
Chloride: 99 mmol/L (ref 98–111)
Creatinine, Ser: 0.89 mg/dL (ref 0.44–1.00)
GFR calc Af Amer: >60 mL/min (ref >60)
GFR calc non Af Amer: >60 mL/min (ref >60)
Glucose, Bld: 147 mg/dL — ABNORMAL HIGH (ref 70–99)
Potassium: 4.2 mmol/L (ref 3.5–5.1)
Sodium: 136 mmol/L (ref 135–145)

## 2018-12-14 LAB — INFLUENZA PANEL BY PCR (TYPE A & B)
Influenza A By PCR: NEGATIVE
Influenza B By PCR: NEGATIVE

## 2018-12-14 LAB — BRAIN NATRIURETIC PEPTIDE: B Natriuretic Peptide: 41 pg/mL (ref 0.0–100.0)

## 2018-12-14 MED ORDER — LEVOFLOXACIN 500 MG PO TABS
500.0000 mg | ORAL_TABLET | Freq: Once | ORAL | Status: AC
  Administered 2018-12-14: 500 mg via ORAL
  Filled 2018-12-14: qty 1

## 2018-12-14 MED ORDER — IOPAMIDOL (ISOVUE-370) INJECTION 76%
100.0000 mL | Freq: Once | INTRAVENOUS | Status: AC | PRN
  Administered 2018-12-14: 100 mL via INTRAVENOUS

## 2018-12-14 MED ORDER — KETOROLAC TROMETHAMINE 30 MG/ML IJ SOLN
10.0000 mg | Freq: Once | INTRAMUSCULAR | Status: AC
  Administered 2018-12-14: 9.9 mg via INTRAVENOUS
  Filled 2018-12-14: qty 1

## 2018-12-14 MED ORDER — LEVOFLOXACIN 500 MG PO TABS: 500.0000 mg | ORAL_TABLET | Freq: Every day | ORAL | 0 refills | Status: AC

## 2018-12-14 MED ORDER — HYDROCODONE-ACETAMINOPHEN 5-325 MG PO TABS: 1.0000 | ORAL_TABLET | ORAL | 0 refills | Status: AC | PRN

## 2018-12-14 MED ORDER — HYDROMORPHONE HCL 1 MG/ML IJ SOLN
0.5000 mg | Freq: Once | INTRAMUSCULAR | Status: AC
  Administered 2018-12-14: 0.5 mg via INTRAVENOUS
  Filled 2018-12-14: qty 1

## 2018-12-14 NOTE — ED Triage Notes (Signed)
Pt states ever since she started on amlodipine, her feet are swollen and painful.  Pt states her pmd is aware of same, has decreased her dose.  Pt also c/o generalized body aches and cold symptoms.

## 2018-12-14 NOTE — ED Notes (Signed)
Patient given discharge instruction, verbalized understand. IV removed, band aid applied. Patient ambulatory out of the department.  

## 2018-12-21 NOTE — ED Provider Notes (Signed)
[facility] EMERGENCY DEPARTMENT Provider Note   CSN: [id number] Arrival date & time: 12/14/18  0455    History   Chief Complaint Chief Complaint  Patient presents with  . Foot Swelling    HPI Frances Miller is a 63 y.o. female.     HPI 63 year old female with lower extremity edema.  Patient attributes this to amlodipine.  She states that her PCP is aware this and recently decreased her dose by half.  She reports that she still having persistent swelling.  Past couple days she has had generalized body aches and cold type symptoms.  She describes this this is a cough and feeling congested.  She does not feel short of breath.  Her cough is nonproductive.  No orthopnea.  Past Medical History:  Diagnosis Date  . Hyperlipidemia   . Hypertension   . MI (myocardial infarction) (HCC)   . Obesity     Patient Active Problem List   Diagnosis Date Noted  . Hypercholesteremia 02/09/2011  . 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 Myocardial infarction of anterolateral wall (HCC) 01/23/2011    Past Surgical History:  Procedure Laterality Date  . CARDIAC CATHETERIZATION    . TUBAL LIGATION       OB History    Gravida  1   Para  1   Term      Preterm      AB      Living  1     SAB      TAB      Ectopic      Multiple      Live Births  1            Home Medications    Prior to Admission medications   Medication Sig Start Date End Date Taking? Authorizing Provider  amLODipine (NORVASC) 10 MG tablet Take 1 tablet (10 mg total) by mouth daily. 07/21/18   Mesner, Barbara Cower, MD  HYDROcodone-acetaminophen (NORCO/VICODIN) 5-325 MG tablet Take 1 tablet by mouth every 4 (four) hours as needed. 12/14/18   Raeford Razor, MD  levofloxacin (LEVAQUIN) 500 MG tablet Take 1 tablet (500 mg total) by mouth daily. 12/14/18   Raeford Razor, MD  lisinopril (PRINIVIL,ZESTRIL) 10 MG tablet Take 1 tablet (10 mg total) by mouth daily. 07/21/18    Mesner, Barbara Cower, MD    Family History Family History  Problem Relation Age of Onset  . Stroke Father   . Heart attack Father 40  . Colon cancer Mother 23  . Hypertension Sister   . Diabetes Sister   . Hypertension Brother   . Hypertension Sister   . Colon cancer Brother 50       unsure what type of cancer    Social History Social History   Tobacco Use  . Smoking status: Never Smoker  . Smokeless tobacco: Never Used  Substance Use Topics  . Alcohol use: No  . Drug use: No     Allergies   Patient has no known allergies.   Review of Systems Review of Systems  All systems reviewed and negative, other than as noted in HPI.  Physical Exam Updated Vital Signs BP 103/68   Pulse 88   Temp 99 F (37.2 C) (Oral)   Resp 18   Ht 5' (1.524 m)   Wt 57.2 kg   SpO2 97%   BMI 24.61 kg/m   Physical Exam Vitals signs and nursing note reviewed.  Constitutional:      General:  She is not in acute distress.    Appearance: She is well-developed.  HENT:     Head: Normocephalic and atraumatic.  Eyes:     General:        Right eye: No discharge.        Left eye: No discharge.     Conjunctiva/sclera: Conjunctivae normal.  Neck:     Musculoskeletal: Neck supple.  Cardiovascular:     Rate and Rhythm: Regular rhythm. Tachycardia present.     Heart sounds: Normal heart sounds. No murmur. No friction rub. No gallop.   Pulmonary:     Effort: Pulmonary effort is normal. No respiratory distress.     Breath sounds: Normal breath sounds.  Abdominal:     General: There is no distension.     Palpations: Abdomen is soft.     Tenderness: There is no abdominal tenderness.  Musculoskeletal:     Comments: Minimal nonpitting lower extremity edema.  Lower extremities are symmetric as compared to each other.  Calf tenderness.  No concerning skin changes.  Skin:    General: Skin is warm and dry.  Neurological:     Mental Status: She is alert.  Psychiatric:        Behavior: Behavior  normal.        Thought Content: Thought content normal.      ED Treatments / Results  Labs (all labs ordered are listed, but only abnormal results are displayed) Labs Reviewed  CBC WITH DIFFERENTIAL/PLATELET - Abnormal; Notable for the following components:      Result Value   WBC 16.0 (*)    RBC 3.21 (*)    Hemoglobin 8.8 (*)    HCT 29.3 (*)    Neutro Abs 13.9 (*)    Abs Immature Granulocytes 0.14 (*)    All other components within normal limits  BASIC METABOLIC PANEL - Abnormal; Notable for the following components:   Glucose, Bld 147 (*)    Calcium 8.8 (*)    All other components within normal limits  INFLUENZA PANEL BY PCR (TYPE A & B)  BRAIN NATRIURETIC PEPTIDE    EKG None  Radiology No results found. Dg Chest 2 View  Result Date: 12/14/2018 CLINICAL DATA:  63 year old female with cough. EXAM: CHEST - 2 VIEW COMPARISON:  Chest radiograph dated 03/15/2012 FINDINGS: There is focal area of airspace opacity in the left lower lobe most consistent with pneumonia. Clinical correlation and follow-up to resolution recommended to exclude a mass. The right lung is clear. There is no pleural effusion or pneumothorax. The cardiac silhouette is within normal limits. No acute osseous pathology. IMPRESSION: Left lower lobe pneumonia. Electronically Signed   By: Elgie Collard M.D.   On: 12/14/2018 06:39   Ct Angio Chest Pe W Or Wo Contrast  Result Date: 12/14/2018 CLINICAL DATA:  Mass on chest x-ray EXAM: CT ANGIOGRAPHY CHEST WITH CONTRAST TECHNIQUE: Multidetector CT imaging of the chest was performed using the standard protocol during bolus administration of intravenous contrast. Multiplanar CT image reconstructions and MIPs were obtained to evaluate the vascular anatomy. CONTRAST:  ISOVUE-370 IOPAMIDOL (ISOVUE-370) INJECTION 76% COMPARISON:  None. FINDINGS: Cardiovascular: There are no filling defects in the pulmonary arterial tree to suggest acute pulmonary thromboembolism. No  obvious evidence of aortic aneurysm or dissection. Atherosclerotic calcifications of the aortic arch are noted. Advanced 3 vessel coronary artery calcification. Mediastinum/Nodes: No abnormal mediastinal adenopathy or pericardial effusion. Visualized thyroid is unremarkable. Lungs/Pleura: There are patchy airspace opacities in the right upper lobe.  There is more confluent consolidation in the posterior basal segment of the left lower lobe. Patchy airspace opacities in the remainder of the left lower lobe. No pneumothorax or pleural effusion. No obvious endobronchial lesion. 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 Musculoskeletal: No vertebral compression deformity. Review of the MIP images confirms the above findings. IMPRESSION: No evidence of acute pulmonary thromboembolism. There is consolidation in the right upper and left lower lobes as described. CT chest in 3 months is recommended to ensure resolution of these findings and exclude underlying malignancy. Initial follow-up by chest CT without contrast is recommended in 3 months to confirm persistence. This recommendation follows the consensus statement: Recommendations for the Management of Subsolid Pulmonary Nodules Detected at CT: A Statement from the Fleischner Society as published in Radiology 2013; 266:304-317. Aortic Atherosclerosis (ICD10-I70.0). Electronically Signed   By: Jolaine Click M.D.   On: 12/14/2018 09:53    Procedures Procedures (including critical care time)  Medications Ordered in ED Medications  ketorolac (TORADOL) 30 MG/ML injection 9.9 mg (9.9 mg Intravenous Given 12/14/18 0848)  HYDROmorphone (DILAUDID) injection 0.5 mg (0.5 mg Intravenous Given 12/14/18 0851)  iopamidol (ISOVUE-370) 76 % injection 100 mL (100 mLs Intravenous Contrast Given 12/14/18 0939)  levofloxacin (LEVAQUIN) tablet 500 mg (500 mg Oral Given 12/14/18 1053)     Initial Impression / Assessment and Plan / ED Course  I have reviewed  the triage vital signs and the nursing notes.  Pertinent labs & imaging results that were available during my care of the patient were reviewed by me and considered in my medical decision making (see chart for details).        63 year old female with pain in her legs.  I suspect this may be more systemic symptoms related to her respiratory infection.  I really cannot appreciate much swelling on my exam.  Minimal at best.  No calf tenderness.  Negative Homans.  She reports recently being on antibiotics for pneumonia.  She thinks azithromycin.  She was given a course of Levaquin.  She needs to follow-up with her PCP.  Emergent return precautions were discussed.  Final Clinical Impressions(s) / ED Diagnoses   Final diagnoses:  Community acquired pneumonia, unspecified laterality  Pain in both lower extremities    ED Discharge Orders         Ordered    levofloxacin (LEVAQUIN) 500 MG tablet  Daily     12/14/18 1030    HYDROcodone-acetaminophen (NORCO/VICODIN) 5-325 MG tablet  Every 4 hours PRN     12/14/18 1030           Raeford Razor, MD 12/21/18 [phone]

## 2019-09-03 ENCOUNTER — Other Ambulatory Visit: Payer: Self-pay

## 2019-09-03 DIAGNOSIS — Z20822 Contact with and (suspected) exposure to covid-19: Secondary | ICD-10-CM

## 2019-09-05 ENCOUNTER — Telehealth: Payer: Self-pay

## 2019-09-05 LAB — SPECIMEN STATUS REPORT

## 2019-09-05 LAB — NOVEL CORONAVIRUS, NAA: SARS-CoV-2, NAA: NOT DETECTED

## 2019-09-05 NOTE — Telephone Encounter (Signed)
Patient given negative result and verbalized understanding  

## 2019-12-20 ENCOUNTER — Ambulatory Visit: Payer: Self-pay | Attending: Internal Medicine

## 2019-12-20 ENCOUNTER — Other Ambulatory Visit: Payer: Self-pay

## 2019-12-20 DIAGNOSIS — Z23 Encounter for immunization: Secondary | ICD-10-CM | POA: Insufficient documentation

## 2019-12-20 NOTE — Progress Notes (Signed)
   Covid-19 Vaccination Clinic  Name:  Frances Miller    MRN: [id number] DOB: 07-07-1956  12/20/2019  Frances Miller was observed post Covid-19 immunization for 15 minutes without incident. She was provided with Vaccine Information Sheet and instruction to access the V-Safe system.   Frances Miller was instructed to call 911 with any severe reactions post vaccine: Marland Kitchen Difficulty breathing  . Swelling of face and throat  . A fast heartbeat  . A bad rash all over body  . Dizziness and weakness   Immunizations Administered    Name Date Dose VIS Date Route   Moderna COVID-19 Vaccine 12/20/2019  8:13 AM 0.5 mL 09/16/2019 Intramuscular   Manufacturer: Moderna   Lot: [id number]   NDC: 29518-841-66

## 2020-01-21 ENCOUNTER — Ambulatory Visit: Payer: Self-pay | Attending: Internal Medicine

## 2020-01-21 DIAGNOSIS — Z23 Encounter for immunization: Secondary | ICD-10-CM

## 2020-01-21 NOTE — Progress Notes (Signed)
   Covid-19 Vaccination Clinic  Name:  Frances Miller    MRN: [id number] DOB: Mar 24, 1956  01/21/2020  [name] was observed post Covid-19 immunization for 15 minutes without incident. She was provided with Vaccine Information Sheet and instruction to access the V-Safe system.   [name] was instructed to call 911 with any severe reactions post vaccine: Marland Kitchen Difficulty breathing  . Swelling of face and throat  . A fast heartbeat  . A bad rash all over body  . Dizziness and weakness   Immunizations Administered    Name Date Dose VIS Date Route   Moderna COVID-19 Vaccine 01/21/2020  8:36 AM 0.5 mL 09/16/2019 Intramuscular   Manufacturer: Gala Murdoch   Lot: [id number]   NDC: 16837-290-21

## 2020-04-04 ENCOUNTER — Encounter: Payer: Self-pay | Admitting: Emergency Medicine

## 2020-04-04 ENCOUNTER — Other Ambulatory Visit: Payer: Self-pay

## 2020-04-04 ENCOUNTER — Ambulatory Visit
Admission: EM | Admit: 2020-04-04 | Discharge: 2020-04-04 | Disposition: A | Discharge status: Home / Self Care | Payer: BC Managed Care – PPO

## 2020-04-04 DIAGNOSIS — M898X6 Other specified disorders of bone, lower leg: Secondary | ICD-10-CM

## 2020-04-04 HISTORY — DX: Anemia, unspecified: D64.9

## 2020-04-04 MED ORDER — PREDNISONE 10 MG PO TABS: 20.0000 mg | ORAL_TABLET | Freq: Every day | ORAL | 0 refills | Status: AC

## 2020-04-04 NOTE — Discharge Instructions (Signed)
Continue to take OTC Tylenol as needed for pain management Take prednisone as prescribed Follow RICE instruction that is attached Follow-up with PCP Return or go to ED for worsening of symptoms

## 2020-04-04 NOTE — ED Triage Notes (Addendum)
Pain when ambulating and palpation to LT leg under knee x 1 week. Pt denies any injuries or any strenuous activities prior to pain.

## 2020-04-04 NOTE — ED Provider Notes (Addendum)
[facility]   [id number] 04/04/20 Arrival Time: 9476   Chief Complaint  Patient presents with  . Leg Pain     SUBJECTIVE: History from: patient.  Frances Miller is a 64 y.o. female who presents to the urgent care with a complaint of left  leg pain for the past 1 week.  Denies any precipitating event or trauma or injury.  She localizes the pain to the left tibia bone right under her left knee.  She describes the pain as constant and achy.  Report pain subside or decrease in range of motion.  She has tried OTC medications with mild relief.  Her symptoms are made worse with ROM.  She denies similar symptoms in the past.     ROS: As per HPI.  All other pertinent ROS negative.     Past Medical History:  Diagnosis Date  . Anemia   . Hyperlipidemia   . Hypertension   . MI (myocardial infarction) (HCC)   . Obesity    Past Surgical History:  Procedure Laterality Date  . CARDIAC CATHETERIZATION    . TUBAL LIGATION     No Known Allergies No current facility-administered medications on file prior to encounter.   Current Outpatient Medications on File Prior to Encounter  Medication Sig Dispense Refill  . aspirin 81 MG chewable tablet Chew by mouth daily.    . ferrous sulfate 325 (65 FE) MG tablet Take 325 mg by mouth daily.    . folic acid (FOLVITE) 1 MG tablet Take 1 mg by mouth daily.    Marland Kitchen HYDROcodone-acetaminophen (NORCO/VICODIN) 5-325 MG tablet Take 1 tablet by mouth every 4 (four) hours as needed. 10 tablet 0  . labetalol (NORMODYNE) 200 MG tablet Take 200 mg by mouth 2 (two) times daily.    Marland Kitchen amLODipine (NORVASC) 10 MG tablet Take 1 tablet (10 mg total) by mouth daily. 30 tablet 0  . levofloxacin (LEVAQUIN) 500 MG tablet Take 1 tablet (500 mg total) by mouth daily. 7 tablet 0  . lisinopril (PRINIVIL,ZESTRIL) 10 MG tablet Take 1 tablet (10 mg total) by mouth daily. 30 tablet 0   Social History   Socioeconomic History  . Marital status: Married    Spouse name:  Not on file  . Number of children: 1  . Years of education: Not on file  . Highest education level: Not on file  Occupational History  . Occupation: Conservation officer, nature  Tobacco Use  . Smoking status: Never Smoker  . Smokeless tobacco: Never Used  Substance and Sexual Activity  . Alcohol use: No  . Drug use: No  . Sexual activity: Yes    Birth control/protection: Surgical  Other Topics Concern  . Not on file  Social History Narrative  . Not on file   Social Determinants of Health   Financial Resource Strain:   . Difficulty of Paying Living Expenses:   Food Insecurity:   . Worried About Programme researcher, broadcasting/film/video in the Last Year:   . Barista in the Last Year:   Transportation Needs:   . Freight forwarder (Medical):   Marland Kitchen Lack of Transportation (Non-Medical):   Physical Activity:   . Days of Exercise per Week:   . Minutes of Exercise per Session:   Stress:   . Feeling of Stress :   Social Connections:   . Frequency of Communication with Friends and Family:   . Frequency of Social Gatherings with Friends and Family:   . Attends Religious  Services:   . Active Member of Clubs or Organizations:   . Attends Archivist Meetings:   Marland Kitchen Marital Status:   Intimate Partner Violence:   . Fear of Current or Ex-Partner:   . Emotionally Abused:   Marland Kitchen Physically Abused:   . Sexually Abused:    Family History  Problem Relation Age of Onset  . Stroke Father   . Heart attack Father 58  . Colon cancer Mother 66  . Hypertension Sister   . Diabetes Sister   . Hypertension Brother   . Hypertension Sister   . Colon cancer Brother 16       unsure what type of cancer    OBJECTIVE:  Vitals:   04/04/20 0847  Weight: 125 lb (56.7 kg)  Height: 5' (1.524 m)     Physical Exam Vitals and nursing note reviewed.  Constitutional:      General: She is not in acute distress.    Appearance: Normal appearance. She is normal weight. She is not ill-appearing, toxic-appearing or  diaphoretic.  Cardiovascular:     Rate and Rhythm: Normal rate and regular rhythm.     Pulses: Normal pulses.     Heart sounds: Normal heart sounds. No murmur heard.  No friction rub. No gallop.   Pulmonary:     Effort: Pulmonary effort is normal. No respiratory distress.     Breath sounds: Normal breath sounds. No stridor. No wheezing, rhonchi or rales.  Chest:     Chest wall: No tenderness.  Musculoskeletal:        General: Tenderness present.     Right lower leg: Normal.     Left lower leg: Tenderness present. No swelling. No edema.     Comments: The left knee is without any obvious asymmetry or deformity compared to the right knee.  Pain present on palpation of tibia right below the left knee.  There is no ecchymosis, erythema, laceration, open wound.  No pain on palpation of calf muscle.  Normal range of motion.  Neurovascular status intact.  Neurological:     Mental Status: She is alert.    LABS:  No results found for this or any previous visit (from the past 24 hour(s)).   ASSESSMENT & PLAN:  1. Pain in left tibia    Patient is stable at discharge.  Symptom is likely from arthritis.  She was advised to continue to take OTC Tylenol as needed for pain.  We will add low-dose prednisone. Patient was offered to have x-rays done, patient declined.  Meds ordered this encounter  Medications  . predniSONE (DELTASONE) 10 MG tablet    Sig: Take 2 tablets (20 mg total) by mouth daily.    Dispense:  15 tablet    Refill:  0    Discharge Instructions  Reviewed expectations re: course of current medical issues. Questions answered. Outlined signs and symptoms indicating need for more acute intervention. Patient verbalized understanding. After Visit Summary given.     Note: This document was prepared using Dragon voice recognition software and may include unintentional dictation errors.     Emerson Monte, FNP 04/04/20 0915    Emerson Monte, FNP 04/04/20  [phone]

## 2020-04-07 ENCOUNTER — Other Ambulatory Visit: Payer: Self-pay

## 2020-04-07 ENCOUNTER — Ambulatory Visit
Admission: EM | Admit: 2020-04-07 | Discharge: 2020-04-07 | Disposition: A | Discharge status: Home / Self Care | Payer: BC Managed Care – PPO | Attending: Emergency Medicine | Admitting: Emergency Medicine

## 2020-04-07 ENCOUNTER — Ambulatory Visit (INDEPENDENT_AMBULATORY_CARE_PROVIDER_SITE_OTHER): Payer: BC Managed Care – PPO

## 2020-04-07 DIAGNOSIS — M898X6 Other specified disorders of bone, lower leg: Secondary | ICD-10-CM | POA: Diagnosis not present

## 2020-04-07 DIAGNOSIS — M79662 Pain in left lower leg: Secondary | ICD-10-CM

## 2020-04-07 MED ORDER — DICLOFENAC POTASSIUM 50 MG PO TABS: 50.0000 mg | ORAL_TABLET | Freq: Three times a day (TID) | ORAL | 0 refills | Status: AC

## 2020-04-07 NOTE — ED Triage Notes (Signed)
Pt returns for pain in left shin area. Pt has difficulty ambulating. Pain is worse with weight bearing

## 2020-04-07 NOTE — Discharge Instructions (Addendum)
Diclofenac was prescribed take as directed Do not take with other NSAID May use ice for pain management Follow-up with PCP Return or go to ED for worsening symptoms

## 2020-04-07 NOTE — ED Provider Notes (Signed)
[facility]   [id number] 04/07/20 Arrival Time: 71   Chief Complaint  Patient presents with  . Leg Pain     SUBJECTIVE: History from: patient.  Frances Miller is a 64 y.o. female who presents with a near for complaint of left leg pain for the past 10 days.  Denies any precipitating event or trauma or injury.  Localizes the pain to the left tibia bone right under her left knee.  She described the pain as constant achy.  Has tried prednisone and OTC Tylenol without relief.  Report pain subside or decrease in range of motion.  Denies similar symptoms in the past.  Denies chills, fever, nausea, vomiting, diarrhea.  ROS: As per HPI.  All other pertinent ROS negative.     Past Medical History:  Diagnosis Date  . Anemia   . Hyperlipidemia   . Hypertension   . MI (myocardial infarction) ([place])   . Obesity    Past Surgical History:  Procedure Laterality Date  . CARDIAC CATHETERIZATION    . TUBAL LIGATION     No Known Allergies No current facility-administered medications on file prior to encounter.   Current Outpatient Medications on File Prior to Encounter  Medication Sig Dispense Refill  . amLODipine (NORVASC) 10 MG tablet Take 1 tablet (10 mg total) by mouth daily. 30 tablet 0  . aspirin 81 MG chewable tablet Chew by mouth daily.    . ferrous sulfate 325 (65 FE) MG tablet Take 325 mg by mouth daily.    . folic acid (FOLVITE) 1 MG tablet Take 1 mg by mouth daily.    Marland Kitchen HYDROcodone-acetaminophen (NORCO/VICODIN) 5-325 MG tablet Take 1 tablet by mouth every 4 (four) hours as needed. 10 tablet 0  . labetalol (NORMODYNE) 200 MG tablet Take 200 mg by mouth 2 (two) times daily.    Marland Kitchen levofloxacin (LEVAQUIN) 500 MG tablet Take 1 tablet (500 mg total) by mouth daily. 7 tablet 0  . lisinopril (PRINIVIL,ZESTRIL) 10 MG tablet Take 1 tablet (10 mg total) by mouth daily. 30 tablet 0  . predniSONE (DELTASONE) 10 MG tablet Take 2 tablets (20 mg total) by mouth daily. 15 tablet 0    Social History   Socioeconomic History  . Marital status: Married    Spouse name: Not on file  . Number of children: 1  . Years of education: Not on file  . Highest education level: Not on file  Occupational History  . Occupation: Scientist, water quality  Tobacco Use  . Smoking status: Never Smoker  . Smokeless tobacco: Never Used  Substance and Sexual Activity  . Alcohol use: No  . Drug use: No  . Sexual activity: Yes    Birth control/protection: Surgical  Other Topics Concern  . Not on file  Social History Narrative  . Not on file   Social Determinants of Health   Financial Resource Strain:   . Difficulty of Paying Living Expenses:   Food Insecurity:   . Worried About Charity fundraiser in the Last Year:   . Arboriculturist in the Last Year:   Transportation Needs:   . Film/video editor (Medical):   Marland Kitchen Lack of Transportation (Non-Medical):   Physical Activity:   . Days of Exercise per Week:   . Minutes of Exercise per Session:   Stress:   . Feeling of Stress :   Social Connections:   . Frequency of Communication with Friends and Family:   . Frequency of Social Gatherings  with Friends and Family:   . Attends Religious Services:   . Active Member of Clubs or Organizations:   . Attends Banker Meetings:   Marland Kitchen Marital Status:   Intimate Partner Violence:   . Fear of Current or Ex-Partner:   . Emotionally Abused:   Marland Kitchen Physically Abused:   . Sexually Abused:    Family History  Problem Relation Age of Onset  . Stroke Father   . Heart attack Father 37  . Colon cancer Mother 31  . Hypertension Sister   . Diabetes Sister   . Hypertension Brother   . Hypertension Sister   . Colon cancer Brother 50       unsure what type of cancer    OBJECTIVE:  Vitals:   04/07/20 1720  BP: (S) (!) 216/102  Pulse: 87  Temp: 98 F (36.7 C)     Physical Exam Vitals and nursing note reviewed.  Constitutional:      General: She is not in acute distress.     Appearance: Normal appearance. She is normal weight. She is not ill-appearing, toxic-appearing or diaphoretic.  Cardiovascular:     Rate and Rhythm: Normal rate and regular rhythm.     Pulses: Normal pulses.     Heart sounds: Normal heart sounds. No murmur heard.  No friction rub. No gallop.   Pulmonary:     Effort: Pulmonary effort is normal. No respiratory distress.     Breath sounds: Normal breath sounds. No stridor. No wheezing, rhonchi or rales.  Chest:     Chest wall: No tenderness.  Musculoskeletal:        General: Tenderness present.     Comments: The left knee is without any obvious asymmetry or deformity when compared to the right knee.  Pain present on palpitation of the left tibia below the left knee.  There is no ecchymosis, erythema, laceration, open wound.  No pain on palpation of calf muscle normal range of motion.  Neurovascular status intact.  Neurological:     Mental Status: She is alert.     LABS:  No results found for this or any previous visit (from the past 24 hour(s)).   ASSESSMENT & PLAN:  1. Pain in left tibia     Meds ordered this encounter  Medications  . diclofenac (CATAFLAM) 50 MG tablet    Sig: Take 1 tablet (50 mg total) by mouth 3 (three) times daily for 20 days.    Dispense:  60 tablet    Refill:  0  Patient is stable at discharge.  Left leg.X-ray is negative for bony abnormality including fracture or dislocation.  I have reviewed the x-ray myself and the radiologist interpretation.  I am in agreement with the radiologist interpretation.   Discharge Instructions Diclofenac was prescribed take as directed Do not take with other NSAID May use ice for pain management Follow-up with PCP Return or go to ED for worsening symptoms  Reviewed expectations re: course of current medical issues. Questions answered. Outlined signs and symptoms indicating need for more acute intervention. Patient verbalized understanding. After Visit Summary  given.     Note: This document was prepared using Dragon voice recognition software and may include unintentional dictation errors.     Durward Parcel, FNP 04/07/20 [phone]

## 2020-04-15 ENCOUNTER — Emergency Department (HOSPITAL_COMMUNITY): Payer: BC Managed Care – PPO

## 2020-04-15 ENCOUNTER — Encounter (HOSPITAL_COMMUNITY): Payer: Self-pay

## 2020-04-15 ENCOUNTER — Other Ambulatory Visit: Payer: Self-pay

## 2020-04-15 ENCOUNTER — Inpatient Hospital Stay (HOSPITAL_COMMUNITY)
Admission: EM | Admit: 2020-04-15 | Discharge: 2020-05-16 | DRG: 871 | Disposition: E | Payer: BC Managed Care – PPO | Attending: Pulmonary Disease | Admitting: Pulmonary Disease

## 2020-04-15 DIAGNOSIS — R0602 Shortness of breath: Secondary | ICD-10-CM | POA: Diagnosis not present

## 2020-04-15 DIAGNOSIS — R6521 Severe sepsis with septic shock: Secondary | ICD-10-CM | POA: Diagnosis present

## 2020-04-15 DIAGNOSIS — R1114 Bilious vomiting: Secondary | ICD-10-CM

## 2020-04-15 DIAGNOSIS — I4901 Ventricular fibrillation: Secondary | ICD-10-CM | POA: Diagnosis not present

## 2020-04-15 DIAGNOSIS — R918 Other nonspecific abnormal finding of lung field: Secondary | ICD-10-CM | POA: Diagnosis present

## 2020-04-15 DIAGNOSIS — I10 Essential (primary) hypertension: Secondary | ICD-10-CM | POA: Diagnosis present

## 2020-04-15 DIAGNOSIS — Z20822 Contact with and (suspected) exposure to covid-19: Secondary | ICD-10-CM | POA: Diagnosis present

## 2020-04-15 DIAGNOSIS — J9601 Acute respiratory failure with hypoxia: Secondary | ICD-10-CM

## 2020-04-15 DIAGNOSIS — R7989 Other specified abnormal findings of blood chemistry: Secondary | ICD-10-CM

## 2020-04-15 DIAGNOSIS — N179 Acute kidney failure, unspecified: Secondary | ICD-10-CM | POA: Diagnosis present

## 2020-04-15 DIAGNOSIS — Z8701 Personal history of pneumonia (recurrent): Secondary | ICD-10-CM

## 2020-04-15 DIAGNOSIS — E669 Obesity, unspecified: Secondary | ICD-10-CM | POA: Diagnosis present

## 2020-04-15 DIAGNOSIS — E872 Acidosis: Secondary | ICD-10-CM | POA: Diagnosis present

## 2020-04-15 DIAGNOSIS — Z8249 Family history of ischemic heart disease and other diseases of the circulatory system: Secondary | ICD-10-CM

## 2020-04-15 DIAGNOSIS — I252 Old myocardial infarction: Secondary | ICD-10-CM

## 2020-04-15 DIAGNOSIS — Z452 Encounter for adjustment and management of vascular access device: Secondary | ICD-10-CM

## 2020-04-15 DIAGNOSIS — I2510 Atherosclerotic heart disease of native coronary artery without angina pectoris: Secondary | ICD-10-CM | POA: Diagnosis present

## 2020-04-15 DIAGNOSIS — R7401 Elevation of levels of liver transaminase levels: Secondary | ICD-10-CM

## 2020-04-15 DIAGNOSIS — A419 Sepsis, unspecified organism: Principal | ICD-10-CM | POA: Diagnosis present

## 2020-04-15 DIAGNOSIS — Z7952 Long term (current) use of systemic steroids: Secondary | ICD-10-CM

## 2020-04-15 DIAGNOSIS — M25562 Pain in left knee: Secondary | ICD-10-CM | POA: Diagnosis present

## 2020-04-15 DIAGNOSIS — J189 Pneumonia, unspecified organism: Secondary | ICD-10-CM

## 2020-04-15 DIAGNOSIS — D509 Iron deficiency anemia, unspecified: Secondary | ICD-10-CM | POA: Diagnosis present

## 2020-04-15 DIAGNOSIS — Z79899 Other long term (current) drug therapy: Secondary | ICD-10-CM

## 2020-04-15 DIAGNOSIS — J80 Acute respiratory distress syndrome: Secondary | ICD-10-CM | POA: Diagnosis present

## 2020-04-15 DIAGNOSIS — Z7982 Long term (current) use of aspirin: Secondary | ICD-10-CM

## 2020-04-15 DIAGNOSIS — Z6824 Body mass index (BMI) 24.0-24.9, adult: Secondary | ICD-10-CM

## 2020-04-15 DIAGNOSIS — T8619 Other complication of kidney transplant: Secondary | ICD-10-CM | POA: Diagnosis present

## 2020-04-15 DIAGNOSIS — I462 Cardiac arrest due to underlying cardiac condition: Secondary | ICD-10-CM | POA: Diagnosis not present

## 2020-04-15 DIAGNOSIS — R739 Hyperglycemia, unspecified: Secondary | ICD-10-CM

## 2020-04-15 DIAGNOSIS — E785 Hyperlipidemia, unspecified: Secondary | ICD-10-CM | POA: Diagnosis present

## 2020-04-15 DIAGNOSIS — R748 Abnormal levels of other serum enzymes: Secondary | ICD-10-CM

## 2020-04-15 DIAGNOSIS — Z792 Long term (current) use of antibiotics: Secondary | ICD-10-CM

## 2020-04-15 DIAGNOSIS — N289 Disorder of kidney and ureter, unspecified: Secondary | ICD-10-CM

## 2020-04-15 MED ORDER — LACTATED RINGERS IV BOLUS
1000.0000 mL | Freq: Once | INTRAVENOUS | Status: AC
  Administered 2020-04-16: 1000 mL via INTRAVENOUS

## 2020-04-15 MED ORDER — ACETAMINOPHEN 325 MG PO TABS
650.0000 mg | ORAL_TABLET | Freq: Once | ORAL | Status: AC
  Administered 2020-04-16: 650 mg via ORAL
  Filled 2020-04-15: qty 2

## 2020-04-15 NOTE — ED Provider Notes (Signed)
[facility] EMERGENCY DEPARTMENT Provider Note   CSN: [id number] Arrival date & time: 04/28/2020  2304   History Chief Complaint  Patient presents with  . Weakness    Frances Miller is a 64 y.o. female.  The history is provided by the patient.  Weakness She has history of hypertension, hyperlipidemia, myocardial infarction and comes in because of feeling generally weak and shortness of breath.  She states that she has generally not felt well for the last week, but started getting short of breath yesterday.  She started coughing today.  Cough is nonproductive.  She was not aware of any fever, and had no chills, but did break out in sweats.  She denies any chest pain or body aches.  She denies nausea, vomiting, diarrhea.  She denies any urinary symptoms.  There has been no change in sense of smell or taste.  She denies any sick contacts.  She has received the COVID-19 vaccination.  Past Medical History:  Diagnosis Date  . Anemia   . Hyperlipidemia   . Hypertension   . MI (myocardial infarction) (HCC)   . Obesity     Patient Active Problem List   Diagnosis Date Noted  . Hypercholesteremia 02/09/2011  . 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 Myocardial infarction of anterolateral wall (HCC) 01/23/2011    Past Surgical History:  Procedure Laterality Date  . CARDIAC CATHETERIZATION    . TUBAL LIGATION       OB History    Gravida  1   Para  1   Term      Preterm      AB      Living  1     SAB      TAB      Ectopic      Multiple      Live Births  1           Family History  Problem Relation Age of Onset  . Stroke Father   . Heart attack Father 69  . Colon cancer Mother 66  . Hypertension Sister   . Diabetes Sister   . Hypertension Brother   . Hypertension Sister   . Colon cancer Brother 50       unsure what type of cancer    Social History   Tobacco Use  . Smoking status: Never Smoker  . Smokeless tobacco:  Never Used  Substance Use Topics  . Alcohol use: No  . Drug use: No    Home Medications Prior to Admission medications   Medication Sig Start Date End Date Taking? Authorizing Provider  amLODipine (NORVASC) 10 MG tablet Take 1 tablet (10 mg total) by mouth daily. 07/21/18   Mesner, Barbara Cower, MD  aspirin 81 MG chewable tablet Chew by mouth daily.    [provider]  diclofenac (CATAFLAM) 50 MG tablet Take 1 tablet (50 mg total) by mouth 3 (three) times daily for 20 days. 04/07/20 04/27/20  Avegno, Zachery Dakins, FNP  ferrous sulfate 325 (65 FE) MG tablet Take 325 mg by mouth daily.    [provider]  folic acid (FOLVITE) 1 MG tablet Take 1 mg by mouth daily.    [provider]  HYDROcodone-acetaminophen (NORCO/VICODIN) 5-325 MG tablet Take 1 tablet by mouth every 4 (four) hours as needed. 12/14/18   Raeford Razor, MD  labetalol (NORMODYNE) 200 MG tablet Take 200 mg by mouth 2 (two) times daily.    [provider]  levofloxacin (  LEVAQUIN) 500 MG tablet Take 1 tablet (500 mg total) by mouth daily. 12/14/18   Raeford Razor, MD  lisinopril (PRINIVIL,ZESTRIL) 10 MG tablet Take 1 tablet (10 mg total) by mouth daily. 07/21/18   Mesner, Barbara Cower, MD  predniSONE (DELTASONE) 10 MG tablet Take 2 tablets (20 mg total) by mouth daily. 04/04/20   Durward Parcel, FNP    Allergies    Patient has no known allergies.  Review of Systems   Review of Systems  Neurological: Positive for weakness.  All other systems reviewed and are negative.   Physical Exam Updated Vital Signs BP 128/79   Pulse (!) 117   Temp (!) 101.1 F (38.4 C) (Oral)   Resp (!) 27   Ht 5' (1.524 m)   Wt 57.2 kg   SpO2 90%   BMI 24.61 kg/m   Physical Exam Vitals and nursing note reviewed.   65 year old female, resting comfortably and in no acute distress. Vital signs are significant for fever, rapid heart rate, rapid respiratory rate. Oxygen saturation is 80%, which is hypoxic, increased to 94%  with supplemental oxygen. Head is normocephalic and atraumatic. PERRLA, EOMI. Oropharynx is clear. Neck is nontender and supple without adenopathy or JVD. Back is nontender and there is no CVA tenderness. Lungs have bibasilar rales which are more prominent on the left.  There are no wheezes or rhonchi. Chest is nontender.  There is no use of accessory muscles of respiration. Heart has regular rate and rhythm without murmur. Abdomen is soft, flat, nontender without masses or hepatosplenomegaly and peristalsis is normoactive. Extremities have 1-2+ edema, full range of motion is present. Skin is warm and dry without rash. Neurologic: Mental status is normal, cranial nerves are intact, there are no motor or sensory deficits.  ED Results / Procedures / Treatments   Labs (all labs ordered are listed, but only abnormal results are displayed) Labs Reviewed - No data to display  EKG Sinus tachycardia 116 bpm.  Normal axis.  Borderline prolonged QT interval.  Incomplete right bundle branch block versus right ventricular hypertrophy.  Left atrial hypertrophy.  Normal ST and T waves.  Compared with ECG of 02/09/2011, QT interval has lengthened, incomplete right bundle branch block is now present.  Radiology No results found.  Procedures Procedures  CRITICAL CARE Performed by: Dione Booze Total critical care time: 55 minutes Critical care time was exclusive of separately billable procedures and treating other patients. Critical care was necessary to treat or prevent imminent or life-threatening deterioration. Critical care was time spent personally by me on the following activities: development of treatment plan with patient and/or surrogate as well as nursing, discussions with consultants, evaluation of patient's response to treatment, examination of patient, obtaining history from patient or surrogate, ordering and performing treatments and interventions, ordering and review of laboratory studies,  ordering and review of radiographic studies, pulse oximetry and re-evaluation of patient's condition.  Medications Ordered in ED Medications  cefTRIAXone (ROCEPHIN) 2 g in sodium chloride 0.9 % 100 mL IVPB (0 g Intravenous Stopped 04/16/20 0602)  azithromycin (ZITHROMAX) 500 mg in sodium chloride 0.9 % 250 mL IVPB (0 mg Intravenous Stopped 04/16/20 0602)  aspirin chewable tablet 81 mg (has no administration in time range)  HYDROcodone-acetaminophen (NORCO/VICODIN) 5-325 MG per tablet 1 tablet (has no administration in time range)  amLODipine (NORVASC) tablet 10 mg (has no administration in time range)  labetalol (NORMODYNE) tablet 200 mg (has no administration in time range)  lisinopril (ZESTRIL) tablet 10 mg (  has no administration in time range)  ferrous sulfate tablet 325 mg (has no administration in time range)  folic acid (FOLVITE) tablet 1 mg (has no administration in time range)  heparin injection 5,000 Units (5,000 Units Subcutaneous Refused 04/16/20 0603)  0.9 %  sodium chloride infusion ( Intravenous New Bag/Given (Non-Interop) 04/16/20 2423)  feeding supplement (ENSURE ENLIVE) (ENSURE ENLIVE) liquid 237 mL (has no administration in time range)  hydrOXYzine (ATARAX/VISTARIL) tablet 25 mg (has no administration in time range)  acetaminophen (TYLENOL) tablet 650 mg (650 mg Oral Given 04/16/20 0034)  lactated ringers bolus 1,000 mL (0 mLs Intravenous Stopped 04/16/20 0221)  lactated ringers bolus 750 mL (0 mLs Intravenous Stopped 04/16/20 0602)  ondansetron (ZOFRAN) injection 4 mg (4 mg Intravenous Given 04/16/20 0254)    ED Course  I have reviewed the triage vital signs and the nursing notes.  Pertinent labs & imaging results that were available during my care of the patient were reviewed by me and considered in my medical decision making (see chart for details).  MDM Rules/Calculators/A&P Fever, cough, dyspnea worrisome for pneumonia.  Consider acute bronchitis.  No risk factors for pulmonary  embolism.  She does meet SIRS criteria, so code sepsis is initiated.  She is given acetaminophen and IV fluids.  Old records are reviewed, and she did have an ED visit for community-acquired pneumonia last year.  She will be empirically started on antibiotics for community-acquired pneumonia.  Since she has received the COVID-19 vaccination, COVID-19 pneumonia is felt to be very unlikely.  1:11 AM Chest x-ray is consistent with Covid pneumonia.  Labs show evidence of renal insufficiency with creatinine significantly increased compared with baseline, but last recorded value was from 2013, so it is uncertain if this actually is an acute kidney injury versus progressive renal insufficiency.  Hyperglycemia is noted.  Incidental finding of minimal elevation of ALT which is not felt to be clinically significant, and mildly elevated alkaline phosphatase of uncertain significance.  COVID-19 PCR test is sent and patient will need to be admitted.  Case is discussed with Dr. Carren Rang of [facility] hospitalist, who agrees to admit the patient.  Frances Miller was evaluated in Emergency Department on 04/16/2020 for the symptoms described in the history of present illness. She was evaluated in the context of the global COVID-19 pandemic, which necessitated consideration that the patient might be at risk for infection with the SARS-CoV-2 virus that causes COVID-19. Institutional protocols and algorithms that pertain to the evaluation of patients at risk for COVID-19 are in a state of rapid change based on information released by regulatory bodies including the CDC and federal and state organizations. These policies and algorithms were followed during the patient's care in the ED.  Final Clinical Impression(s) / ED Diagnoses Final diagnoses:  Community acquired pneumonia, unspecified laterality  Renal insufficiency  Hyperglycemia  Elevated ALT measurement  Elevated alkaline phosphatase level  Elevated lactic acid  level    Rx / DC Orders ED Discharge Orders    None       Dione Booze, MD 04/16/20 [phone]

## 2020-04-15 NOTE — ED Triage Notes (Signed)
Pt states she has not felt good for a week, has been to urgent care and has called her pmd for same.  Pt states she just feels very weak and tired. Pt reports sob that started today as well.

## 2020-04-16 ENCOUNTER — Inpatient Hospital Stay (HOSPITAL_COMMUNITY): Payer: BC Managed Care – PPO

## 2020-04-16 ENCOUNTER — Encounter (HOSPITAL_COMMUNITY): Payer: Self-pay | Admitting: Family Medicine

## 2020-04-16 DIAGNOSIS — R739 Hyperglycemia, unspecified: Secondary | ICD-10-CM | POA: Diagnosis present

## 2020-04-16 DIAGNOSIS — E785 Hyperlipidemia, unspecified: Secondary | ICD-10-CM | POA: Diagnosis present

## 2020-04-16 DIAGNOSIS — J80 Acute respiratory distress syndrome: Secondary | ICD-10-CM | POA: Diagnosis present

## 2020-04-16 DIAGNOSIS — Z792 Long term (current) use of antibiotics: Secondary | ICD-10-CM | POA: Diagnosis not present

## 2020-04-16 DIAGNOSIS — I2510 Atherosclerotic heart disease of native coronary artery without angina pectoris: Secondary | ICD-10-CM | POA: Diagnosis present

## 2020-04-16 DIAGNOSIS — A419 Sepsis, unspecified organism: Secondary | ICD-10-CM | POA: Diagnosis present

## 2020-04-16 DIAGNOSIS — E872 Acidosis: Secondary | ICD-10-CM | POA: Diagnosis present

## 2020-04-16 DIAGNOSIS — N289 Disorder of kidney and ureter, unspecified: Secondary | ICD-10-CM | POA: Diagnosis not present

## 2020-04-16 DIAGNOSIS — Z6824 Body mass index (BMI) 24.0-24.9, adult: Secondary | ICD-10-CM | POA: Diagnosis not present

## 2020-04-16 DIAGNOSIS — J189 Pneumonia, unspecified organism: Secondary | ICD-10-CM | POA: Diagnosis present

## 2020-04-16 DIAGNOSIS — M25562 Pain in left knee: Secondary | ICD-10-CM | POA: Diagnosis present

## 2020-04-16 DIAGNOSIS — N179 Acute kidney failure, unspecified: Secondary | ICD-10-CM | POA: Diagnosis present

## 2020-04-16 DIAGNOSIS — Z20822 Contact with and (suspected) exposure to covid-19: Secondary | ICD-10-CM | POA: Diagnosis present

## 2020-04-16 DIAGNOSIS — I4901 Ventricular fibrillation: Secondary | ICD-10-CM | POA: Diagnosis not present

## 2020-04-16 DIAGNOSIS — J9601 Acute respiratory failure with hypoxia: Secondary | ICD-10-CM | POA: Diagnosis not present

## 2020-04-16 DIAGNOSIS — R7401 Elevation of levels of liver transaminase levels: Secondary | ICD-10-CM | POA: Diagnosis present

## 2020-04-16 DIAGNOSIS — Z8249 Family history of ischemic heart disease and other diseases of the circulatory system: Secondary | ICD-10-CM | POA: Diagnosis not present

## 2020-04-16 DIAGNOSIS — R0602 Shortness of breath: Secondary | ICD-10-CM | POA: Diagnosis present

## 2020-04-16 DIAGNOSIS — D509 Iron deficiency anemia, unspecified: Secondary | ICD-10-CM | POA: Diagnosis present

## 2020-04-16 DIAGNOSIS — I10 Essential (primary) hypertension: Secondary | ICD-10-CM | POA: Diagnosis present

## 2020-04-16 DIAGNOSIS — I252 Old myocardial infarction: Secondary | ICD-10-CM | POA: Diagnosis not present

## 2020-04-16 DIAGNOSIS — R6520 Severe sepsis without septic shock: Secondary | ICD-10-CM

## 2020-04-16 DIAGNOSIS — I462 Cardiac arrest due to underlying cardiac condition: Secondary | ICD-10-CM | POA: Diagnosis not present

## 2020-04-16 DIAGNOSIS — R918 Other nonspecific abnormal finding of lung field: Secondary | ICD-10-CM | POA: Diagnosis not present

## 2020-04-16 DIAGNOSIS — Z8701 Personal history of pneumonia (recurrent): Secondary | ICD-10-CM | POA: Diagnosis not present

## 2020-04-16 DIAGNOSIS — T8619 Other complication of kidney transplant: Secondary | ICD-10-CM | POA: Diagnosis present

## 2020-04-16 DIAGNOSIS — E669 Obesity, unspecified: Secondary | ICD-10-CM | POA: Diagnosis present

## 2020-04-16 DIAGNOSIS — R748 Abnormal levels of other serum enzymes: Secondary | ICD-10-CM | POA: Diagnosis present

## 2020-04-16 DIAGNOSIS — R6521 Severe sepsis with septic shock: Secondary | ICD-10-CM | POA: Diagnosis present

## 2020-04-16 LAB — COMPREHENSIVE METABOLIC PANEL
ALT: 45 U/L — ABNORMAL HIGH (ref 0–44)
ALT: 51 U/L — ABNORMAL HIGH (ref 0–44)
AST: 31 U/L (ref 15–41)
AST: 34 U/L (ref 15–41)
Albumin: 2.3 g/dL — ABNORMAL LOW (ref 3.5–5.0)
Albumin: 2.6 g/dL — ABNORMAL LOW (ref 3.5–5.0)
Alkaline Phosphatase: 164 U/L — ABNORMAL HIGH (ref 38–126)
Alkaline Phosphatase: 183 U/L — ABNORMAL HIGH (ref 38–126)
Anion gap: 12 (ref 5–15)
Anion gap: 13 (ref 5–15)
BUN: 32 mg/dL — ABNORMAL HIGH (ref 8–23)
BUN: 32 mg/dL — ABNORMAL HIGH (ref 8–23)
CO2: 20 mmol/L — ABNORMAL LOW (ref 22–32)
CO2: 23 mmol/L (ref 22–32)
Calcium: 8.1 mg/dL — ABNORMAL LOW (ref 8.9–10.3)
Calcium: 8.2 mg/dL — ABNORMAL LOW (ref 8.9–10.3)
Chloride: 100 mmol/L (ref 98–111)
Chloride: 102 mmol/L (ref 98–111)
Creatinine, Ser: 1.59 mg/dL — ABNORMAL HIGH (ref 0.44–1.00)
Creatinine, Ser: 1.79 mg/dL — ABNORMAL HIGH (ref 0.44–1.00)
GFR calc Af Amer: 34 mL/min — ABNORMAL LOW (ref >60)
GFR calc Af Amer: 39 mL/min — ABNORMAL LOW (ref >60)
GFR calc non Af Amer: 29 mL/min — ABNORMAL LOW (ref >60)
GFR calc non Af Amer: 34 mL/min — ABNORMAL LOW (ref >60)
Glucose, Bld: 192 mg/dL — ABNORMAL HIGH (ref 70–99)
Glucose, Bld: 278 mg/dL — ABNORMAL HIGH (ref 70–99)
Potassium: 3.6 mmol/L (ref 3.5–5.1)
Potassium: 3.6 mmol/L (ref 3.5–5.1)
Sodium: 133 mmol/L — ABNORMAL LOW (ref 135–145)
Sodium: 137 mmol/L (ref 135–145)
Total Bilirubin: 0.7 mg/dL (ref 0.3–1.2)
Total Bilirubin: 1 mg/dL (ref 0.3–1.2)
Total Protein: 6.2 g/dL — ABNORMAL LOW (ref 6.5–8.1)
Total Protein: 6.7 g/dL (ref 6.5–8.1)

## 2020-04-16 LAB — SARS CORONAVIRUS 2 BY RT PCR (HOSPITAL ORDER, PERFORMED IN ~~LOC~~ HOSPITAL LAB)
SARS Coronavirus 2: NEGATIVE
SARS Coronavirus 2: NEGATIVE

## 2020-04-16 LAB — CBC WITH DIFFERENTIAL/PLATELET
Abs Immature Granulocytes: 0.14 10*3/uL — ABNORMAL HIGH (ref 0.00–0.07)
Abs Immature Granulocytes: 0.21 10*3/uL — ABNORMAL HIGH (ref 0.00–0.07)
Basophils Absolute: 0 10*3/uL (ref 0.0–0.1)
Basophils Absolute: 0 10*3/uL (ref 0.0–0.1)
Basophils Relative: 0 %
Basophils Relative: 0 %
Eosinophils Absolute: 0.1 10*3/uL (ref 0.0–0.5)
Eosinophils Absolute: 0.2 10*3/uL (ref 0.0–0.5)
Eosinophils Relative: 1 %
Eosinophils Relative: 1 %
HCT: 27.2 % — ABNORMAL LOW (ref 36.0–46.0)
HCT: 27.4 % — ABNORMAL LOW (ref 36.0–46.0)
Hemoglobin: 8.4 g/dL — ABNORMAL LOW (ref 12.0–15.0)
Hemoglobin: 8.7 g/dL — ABNORMAL LOW (ref 12.0–15.0)
Immature Granulocytes: 1 %
Immature Granulocytes: 2 %
Lymphocytes Relative: 3 %
Lymphocytes Relative: 3 %
Lymphs Abs: 0.5 10*3/uL — ABNORMAL LOW (ref 0.7–4.0)
Lymphs Abs: 0.5 10*3/uL — ABNORMAL LOW (ref 0.7–4.0)
MCH: 26.1 pg (ref 26.0–34.0)
MCH: 26.1 pg (ref 26.0–34.0)
MCHC: 30.9 g/dL (ref 30.0–36.0)
MCHC: 31.8 g/dL (ref 30.0–36.0)
MCV: 82.3 fL (ref 80.0–100.0)
MCV: 84.5 fL (ref 80.0–100.0)
Monocytes Absolute: 0.8 10*3/uL (ref 0.1–1.0)
Monocytes Absolute: 1.1 10*3/uL — ABNORMAL HIGH (ref 0.1–1.0)
Monocytes Relative: 6 %
Monocytes Relative: 7 %
Neutro Abs: 12.3 10*3/uL — ABNORMAL HIGH (ref 1.7–7.7)
Neutro Abs: 12.7 10*3/uL — ABNORMAL HIGH (ref 1.7–7.7)
Neutrophils Relative %: 87 %
Neutrophils Relative %: 89 %
Platelets: 245 10*3/uL (ref 150–400)
Platelets: 255 10*3/uL (ref 150–400)
RBC: 3.22 MIL/uL — ABNORMAL LOW (ref 3.87–5.11)
RBC: 3.33 MIL/uL — ABNORMAL LOW (ref 3.87–5.11)
RDW: 16.1 % — ABNORMAL HIGH (ref 11.5–15.5)
RDW: 16.3 % — ABNORMAL HIGH (ref 11.5–15.5)
WBC: 14.1 10*3/uL — ABNORMAL HIGH (ref 4.0–10.5)
WBC: 14.3 10*3/uL — ABNORMAL HIGH (ref 4.0–10.5)
nRBC: 0 % (ref 0.0–0.2)
nRBC: 0 % (ref 0.0–0.2)

## 2020-04-16 LAB — URINALYSIS, ROUTINE W REFLEX MICROSCOPIC
Bilirubin Urine: NEGATIVE
Glucose, UA: NEGATIVE mg/dL
Hgb urine dipstick: NEGATIVE
Ketones, ur: NEGATIVE mg/dL
Leukocytes,Ua: NEGATIVE
Nitrite: NEGATIVE
Protein, ur: 30 mg/dL — AB
Specific Gravity, Urine: 1.025 (ref 1.005–1.030)
pH: 5 (ref 5.0–8.0)

## 2020-04-16 LAB — SEDIMENTATION RATE: Sed Rate: 110 mm/hr — ABNORMAL HIGH (ref 0–22)

## 2020-04-16 LAB — MAGNESIUM: Magnesium: 1.8 mg/dL (ref 1.7–2.4)

## 2020-04-16 LAB — TROPONIN I (HIGH SENSITIVITY)
Troponin I (High Sensitivity): 56 ng/L — ABNORMAL HIGH (ref <18)
Troponin I (High Sensitivity): 52 ng/L — ABNORMAL HIGH (ref <18)

## 2020-04-16 LAB — APTT
aPTT: 37 seconds — ABNORMAL HIGH (ref 24–36)
aPTT: 43 seconds — ABNORMAL HIGH (ref 24–36)

## 2020-04-16 LAB — LACTIC ACID, PLASMA
Lactic Acid, Venous: 2 mmol/L (ref 0.5–1.9)
Lactic Acid, Venous: 2.8 mmol/L (ref 0.5–1.9)

## 2020-04-16 LAB — BLOOD GAS, ARTERIAL
Acid-base deficit: 2.6 mmol/L — ABNORMAL HIGH (ref 0.0–2.0)
Bicarbonate: 22.2 mmol/L (ref 20.0–28.0)
FIO2: 100
O2 Saturation: 97 %
Patient temperature: 36.6
pCO2 arterial: 37.3 mmHg (ref 32.0–48.0)
pH, Arterial: 7.381 (ref 7.350–7.450)
pO2, Arterial: 96.5 mmHg (ref 83.0–108.0)

## 2020-04-16 LAB — HIV ANTIBODY (ROUTINE TESTING W REFLEX): HIV Screen 4th Generation wRfx: NONREACTIVE

## 2020-04-16 LAB — PROCALCITONIN: Procalcitonin: 2.07 ng/mL

## 2020-04-16 LAB — PROTIME-INR
INR: 1.5 — ABNORMAL HIGH (ref 0.8–1.2)
INR: 1.6 — ABNORMAL HIGH (ref 0.8–1.2)
Prothrombin Time: 17.7 seconds — ABNORMAL HIGH (ref 11.4–15.2)
Prothrombin Time: 18.2 seconds — ABNORMAL HIGH (ref 11.4–15.2)

## 2020-04-16 LAB — BLOOD GAS, VENOUS
Acid-base deficit: 0 mmol/L (ref 0.0–2.0)
Bicarbonate: 24.2 mmol/L (ref 20.0–28.0)
FIO2: 100
O2 Saturation: 78.6 %
Patient temperature: 37
pCO2, Ven: 39.7 mmHg — ABNORMAL LOW (ref 44.0–60.0)
pH, Ven: 7.402 (ref 7.250–7.430)
pO2, Ven: 49.4 mmHg — ABNORMAL HIGH (ref 32.0–45.0)

## 2020-04-16 LAB — STREP PNEUMONIAE URINARY ANTIGEN: Strep Pneumo Urinary Antigen: NEGATIVE

## 2020-04-16 LAB — D-DIMER, QUANTITATIVE: D-Dimer, Quant: 3.26 ug/mL-FEU — ABNORMAL HIGH (ref 0.00–0.50)

## 2020-04-16 MED ORDER — LEVALBUTEROL HCL 0.63 MG/3ML IN NEBU
0.6300 mg | INHALATION_SOLUTION | Freq: Four times a day (QID) | RESPIRATORY_TRACT | Status: DC
  Administered 2020-04-16 – 2020-04-17 (×2): 0.63 mg via RESPIRATORY_TRACT
  Filled 2020-04-16: qty 3

## 2020-04-16 MED ORDER — ORAL CARE MOUTH RINSE
15.0000 mL | Freq: Two times a day (BID) | OROMUCOSAL | Status: DC
  Administered 2020-04-17: 15 mL via OROMUCOSAL

## 2020-04-16 MED ORDER — LABETALOL HCL 200 MG PO TABS
200.0000 mg | ORAL_TABLET | Freq: Two times a day (BID) | ORAL | Status: DC
  Administered 2020-04-16 – 2020-04-17 (×3): 200 mg via ORAL
  Filled 2020-04-16 (×3): qty 1

## 2020-04-16 MED ORDER — LISINOPRIL 10 MG PO TABS
10.0000 mg | ORAL_TABLET | Freq: Every day | ORAL | Status: DC
  Filled 2020-04-16: qty 1

## 2020-04-16 MED ORDER — VANCOMYCIN HCL 1500 MG/300ML IV SOLN
1500.0000 mg | Freq: Once | INTRAVENOUS | Status: AC
  Administered 2020-04-16: 1500 mg via INTRAVENOUS
  Filled 2020-04-16: qty 300

## 2020-04-16 MED ORDER — TECHNETIUM TO 99M ALBUMIN AGGREGATED
4.0000 | Freq: Once | INTRAVENOUS | Status: AC | PRN
  Administered 2020-04-16: 4.4 via INTRAVENOUS

## 2020-04-16 MED ORDER — LACTATED RINGERS IV SOLN: INTRAVENOUS | Status: AC

## 2020-04-16 MED ORDER — SODIUM CHLORIDE 0.9 % IV SOLN
500.0000 mg | INTRAVENOUS | Status: DC
  Administered 2020-04-16 – 2020-04-17 (×3): 500 mg via INTRAVENOUS
  Filled 2020-04-16 (×3): qty 500

## 2020-04-16 MED ORDER — ENSURE ENLIVE PO LIQD
237.0000 mL | Freq: Two times a day (BID) | ORAL | Status: DC
  Administered 2020-04-16: 237 mL via ORAL
  Filled 2020-04-16 (×7): qty 237

## 2020-04-16 MED ORDER — SODIUM CHLORIDE 0.9 % IV SOLN: INTRAVENOUS | Status: AC

## 2020-04-16 MED ORDER — SODIUM CHLORIDE 0.9 % IV SOLN
2.0000 g | INTRAVENOUS | Status: DC
  Administered 2020-04-16 (×2): 2 g via INTRAVENOUS
  Filled 2020-04-16 (×2): qty 20

## 2020-04-16 MED ORDER — ASPIRIN 81 MG PO CHEW
81.0000 mg | CHEWABLE_TABLET | Freq: Every day | ORAL | Status: DC
  Administered 2020-04-16 – 2020-04-17 (×2): 81 mg via ORAL
  Filled 2020-04-16 (×2): qty 1

## 2020-04-16 MED ORDER — CHLORHEXIDINE GLUCONATE 0.12 % MT SOLN
15.0000 mL | Freq: Two times a day (BID) | OROMUCOSAL | Status: DC
  Administered 2020-04-17: 15 mL via OROMUCOSAL
  Filled 2020-04-16: qty 15

## 2020-04-16 MED ORDER — AMLODIPINE BESYLATE 5 MG PO TABS
10.0000 mg | ORAL_TABLET | Freq: Every day | ORAL | Status: DC
  Filled 2020-04-16: qty 2

## 2020-04-16 MED ORDER — LORAZEPAM 2 MG/ML IJ SOLN
1.0000 mg | Freq: Four times a day (QID) | INTRAMUSCULAR | Status: DC | PRN
  Administered 2020-04-16: 1 mg via INTRAVENOUS
  Filled 2020-04-16: qty 1

## 2020-04-16 MED ORDER — HYDROXYZINE HCL 25 MG PO TABS
25.0000 mg | ORAL_TABLET | Freq: Three times a day (TID) | ORAL | Status: DC | PRN
  Filled 2020-04-16: qty 1

## 2020-04-16 MED ORDER — HEPARIN SODIUM (PORCINE) 5000 UNIT/ML IJ SOLN
5000.0000 [IU] | Freq: Three times a day (TID) | INTRAMUSCULAR | Status: DC
  Administered 2020-04-16 – 2020-04-17 (×5): 5000 [IU] via SUBCUTANEOUS
  Filled 2020-04-16 (×5): qty 1

## 2020-04-16 MED ORDER — VANCOMYCIN HCL 500 MG/100ML IV SOLN
500.0000 mg | INTRAVENOUS | Status: DC
  Filled 2020-04-16 (×2): qty 100

## 2020-04-16 MED ORDER — LACTATED RINGERS IV BOLUS (SEPSIS)
750.0000 mL | Freq: Once | INTRAVENOUS | Status: AC
  Administered 2020-04-16: 750 mL via INTRAVENOUS

## 2020-04-16 MED ORDER — ONDANSETRON HCL 4 MG/2ML IJ SOLN
4.0000 mg | Freq: Once | INTRAMUSCULAR | Status: AC
  Administered 2020-04-16: 4 mg via INTRAVENOUS
  Filled 2020-04-16: qty 2

## 2020-04-16 MED ORDER — HYDROCOD POLST-CPM POLST ER 10-8 MG/5ML PO SUER
5.0000 mL | Freq: Two times a day (BID) | ORAL | Status: DC | PRN
  Filled 2020-04-16: qty 5

## 2020-04-16 MED ORDER — FOLIC ACID 1 MG PO TABS
1.0000 mg | ORAL_TABLET | Freq: Every day | ORAL | Status: DC
  Administered 2020-04-16 – 2020-04-17 (×2): 1 mg via ORAL
  Filled 2020-04-16 (×2): qty 1

## 2020-04-16 MED ORDER — IPRATROPIUM BROMIDE 0.02 % IN SOLN
0.5000 mg | Freq: Four times a day (QID) | RESPIRATORY_TRACT | Status: DC
  Administered 2020-04-16 – 2020-04-17 (×4): 0.5 mg via RESPIRATORY_TRACT
  Filled 2020-04-16 (×3): qty 2.5

## 2020-04-16 MED ORDER — ACETAMINOPHEN 650 MG RE SUPP
650.0000 mg | Freq: Four times a day (QID) | RECTAL | Status: DC | PRN
  Administered 2020-04-16 – 2020-04-17 (×2): 650 mg via RECTAL
  Filled 2020-04-16 (×2): qty 1

## 2020-04-16 MED ORDER — CHLORHEXIDINE GLUCONATE CLOTH 2 % EX PADS
6.0000 | MEDICATED_PAD | Freq: Every day | CUTANEOUS | Status: DC
  Administered 2020-04-17: 6 via TOPICAL

## 2020-04-16 MED ORDER — FERROUS SULFATE 325 (65 FE) MG PO TABS
325.0000 mg | ORAL_TABLET | Freq: Every day | ORAL | Status: DC
  Administered 2020-04-16 – 2020-04-17 (×2): 325 mg via ORAL
  Filled 2020-04-16 (×2): qty 1

## 2020-04-16 MED ORDER — HYDROCODONE-ACETAMINOPHEN 5-325 MG PO TABS: 1.0000 | ORAL_TABLET | ORAL | Status: DC | PRN

## 2020-04-16 NOTE — Progress Notes (Signed)
Pharmacy Antibiotic Note  Frances Miller is a 64 y.o. female admitted on 2020-04-26 with osteomyelitis.  Pharmacy has been consulted for vancomycin dosing.  Patient is also on azithromycin and ceftriaxone for CAP.  Plan: Give vancomycin 1.5g  IV x1 dose, then vancomycin 500mg  IV q24h Goal vancomycin trough range:    15-20  mcg/mL Pharmacy will continue to monitor renal function, vancomycin troughs as clinically appropriate,  cultures and patient progress.    Height: 5' (152.4 cm) Weight: 57.2 kg (126 lb) IBW/kg (Calculated) : 45.5  Temp (24hrs), Avg:99.2 F (37.3 C), Min:97.9 F (36.6 C), Max:101.1 F (38.4 C)  Recent Labs  Lab April 26, 2020 2352 04/16/20 0201 04/16/20 0621  WBC 14.1*  --  14.3*  CREATININE 1.79*  --  1.59*  LATICACIDVEN 2.8* 2.0*  --     Estimated Creatinine Clearance: 28.3 mL/min (A) (by C-G formula based on SCr of 1.59 mg/dL (H)).    No Known Allergies  Antimicrobials this admission: azithromycin 7/1>>   vancomycin 7/1  >>   ceftriaxone 71>>   Microbiology results: 7/2 BC x2:  NG <12h 7/2 UCx:    7/2 Sputum: 7/2 Legionella Ur Ag: 7/2 S. Pneumo Ur Ag: 7/2 Resp PCR: SARS CoV-2 negative     Thank you for allowing pharmacy to be a part of this patient's care.  9/2 04/16/2020 2:41 PM

## 2020-04-16 NOTE — Progress Notes (Signed)
**Note De-Identified Frances Miller Obfuscation** Patient removed from BIPAP and placed on 15 L HFNC. Patient alert and oriented asked to be removed from BIPAP and tolerating moderatly well at this time.  SAT fluctuates in mid to low 80s.  RRT to continue to monitor.

## 2020-04-16 NOTE — Consult Note (Signed)
NAME:  Frances Miller, MRN:  [id number], DOB:  02/29/1956, LOS: 0 ADMISSION DATE:  04/20/2020, CONSULTATION DATE:  04/16/20 REFERRING MD:  [facility], CHIEF COMPLAINT:  ? sepsis   Brief History   [age] yobf never smoker with hbp on ACEi fine until 10 d pta with L leg pain below the knee > neg xrays  6/23 and rx pred no better then nsaids  No better then 6/30 acute fatigue / sob to ER pm 7/1 with fever 101.1 , hypoxemia and bilateral infiltrates with neg covid 19 pcr so rx as CAP and PCCM service consulted am 7/2   History of present illness   64 y.o. female, with history of obesity, myocardial infarction, hypertension, hyperlipidemia, and anemia presents to the ER with 2 days of feeling weak tired and short of breath.  Patient reports that the symptoms started suddenly on 6/30 while she was at work.  She felt so tired that she could not walk and asked her boss if she could just go home.  When she got home she just sat on the couch and did not do anything.  Her shortness of breath seemed unchanged by exertion.  But her weakness and fatigue was worse with exertion.  She had no fevers and no chills at home.  She did have an increase in cough, productive of white phlegm.  She did not see any blood in her sputum.  Patient did not try any medications to alleviate her symptoms.  She reports that she did have pneumonia before.  It was several years ago.        ED highlights Fever of 101.1, white blood cell count of 14.1, heart rate of 108 Bilateral interstitial and groundglass opacities most compatible with atypical infection including COVID-19.  Other atypical organisms can give similar pattern of infection as can noninfectious causes such as hypersensitivity pneumonitis. -Patient denied any new exposures or sick contacts She had modena vaccination completing 2nd one  01/21/20  She was started on Zithromax and Rocephin in the ED.  Past Medical History   HBP MI  Anemia Hyperlipidemia  Significant Hospital  Events     Consults:  PCCM  Procedures:    Significant Diagnostic Tests:  V/Q  7/ 2  Neg  L knee  7/2   Micro Data:  BC x 2  7/2 Sars PCR  7/2 neg  Legionella Ag 7/2 >>> Strep uirinary Ag 7/2 >>> HIV 7/2 >>> PCT 7/2 >>>  Antimicrobials:  Levaquin on admit Rocphin 7/1 >>> Zmax  7/1 >>> Vanc  7/2 >>>   Scheduled Meds: . aspirin  81 mg Oral Daily  . feeding supplement (ENSURE ENLIVE)  237 mL Oral BID BM  . ferrous sulfate  325 mg Oral Daily  . folic acid  1 mg Oral Daily  . heparin  5,000 Units Subcutaneous Q8H  . labetalol  200 mg Oral BID   Continuous Infusions: . sodium chloride 75 mL/hr at 04/16/20 0623  . azithromycin Stopped (04/16/20 0602)  . cefTRIAXone (ROCEPHIN)  IV Stopped (04/16/20 0602)   PRN Meds:.HYDROcodone-acetaminophen, hydrOXYzine   Interim history/subjective:  Increasing 02 needs x 24 h/ some cough > white mucus, no cp or joint pain   Objective   Blood pressure (!) 155/88, pulse (!) 106, temperature 97.9 F (36.6 C), temperature source Oral, resp. rate (!) 32, height 5' (1.524 m), weight 57.2 kg, SpO2 (!) 83 %.       No intake or output data in the 24 hours  ending 04/16/20 1347 Filed Weights   04/17/2020 2328  Weight: 57.2 kg    Examination: General: anxious bf happily hypoxemic on NRM with one flap Lungs: coarse bs, no significant crackles  Cardiovascular: RRR no s3 Abdomen: abd soft/ Extremities: warm with STS below L knee s effusion and full ROM  Neuro: intact sensorium     I personally reviewed images and agree with radiology impression as follows:  CXR:   Portable 7/1  1. Bilateral interstitial and ground-glass opacities most compatible with atypical infection, including COVID-19. Other atypical organisms can give a similar pattern of infection, as can non infectious causes such as hypersensitivity pneumonitis  Resolved Hospital Problem list      Assessment & Plan:  1)   Acute hypoxemic resp failure in setting of  febrile illness s typical pna features and only new c/o is new pain and swelling in soft tissue below L knee rx with pred pre admit  ? Atypical pna ? covid 19 with false neg PCR ? Septic "arthitis"  >  Add vancomycin pm 7/2 / check HIV status  >>> for now does not look septic but more concerned about hypoxemia so rx  Here for now pending studies ordered   2) hbp on acie/ amlopidine  > d/c both pm 7/2 until we know where this illness is headed   4) Anemia ? Chronic but acute concern in this setting    Best practice:  Diet:  Regular  Pain/Anxiety/Delirium protocol (if indicated): n/a VAP protocol (if indicated): n/a DVT prophylaxis: lovenox GI prophylaxis: n/a Glucose control: n/a  Mobility: bed rest for now  Code Status: full  Family Communication: n/a  Disposition:  ICU with low threshold to transfer to [facility]  Labs   CBC: Recent Labs  Lab 05/05/2020 2352 04/16/20 0621  WBC 14.1* 14.3*  NEUTROABS 12.3* 12.7*  HGB 8.7* 8.4*  HCT 27.4* 27.2*  MCV 82.3 84.5  PLT 255 245    Basic Metabolic Panel: Recent Labs  Lab 04/17/2020 2352 04/16/20 0621  NA 133* 137  K 3.6 3.6  CL 100 102  CO2 20* 23  GLUCOSE 278* 192*  BUN 32* 32*  CREATININE 1.79* 1.59*  CALCIUM 8.1* 8.2*  MG  --  1.8   GFR: Estimated Creatinine Clearance: 28.3 mL/min (A) (by C-G formula based on SCr of 1.59 mg/dL (H)). Recent Labs  Lab 04/29/2020 2352 04/16/20 0201 04/16/20 0621  WBC 14.1*  --  14.3*  LATICACIDVEN 2.8* 2.0*  --     Liver Function Tests: Recent Labs  Lab 04/24/2020 2352 04/16/20 0621  AST 34 31  ALT 51* 45*  ALKPHOS 183* 164*  BILITOT 1.0 0.7  PROT 6.7 6.2*  ALBUMIN 2.6* 2.3*   No results for input(s): LIPASE, AMYLASE in the last 168 hours. No results for input(s): AMMONIA in the last 168 hours.  ABG    Component Value Date/Time   HCO3 24.2 04/16/2020 0623   TCO2 22 01/23/2011 1944   ACIDBASEDEF 0.0 04/16/2020 0623   O2SAT 78.6 04/16/2020 0623     Coagulation  Profile: Recent Labs  Lab 04/22/2020 2352 04/16/20 0621  INR 1.5* 1.6*    Cardiac Enzymes: No results for input(s): CKTOTAL, CKMB, CKMBINDEX, TROPONINI in the last 168 hours.  HbA1C: Hemoglobin A1C  Date/Time Value Ref Range Status  03/15/2012 12:18 PM 8.5 (H) 4.2 - 6.3 % Final    Comment:    The American Diabetes Association recommends that a primary goal of therapy should be <7%  and that physicians should reevaluate the treatment regimen in patients with HbA1c values consistently >8%.    Hgb A1c MFr Bld  Date/Time Value Ref Range Status  01/23/2011 08:19 PM (H) <5.7 % Final   11.1 (NOTE)                                                                       According to the ADA Clinical Practice Recommendations for 2011, when HbA1c is used as a screening test:   >=6.5%   Diagnostic of Diabetes Mellitus           (if abnormal result  is confirmed)  5.7-6.4%   Increased risk of developing Diabetes Mellitus  References:Diagnosis and Classification of Diabetes Mellitus,Diabetes Care,2011,34(Suppl 1):S62-S69 and Standards of Medical Care in         Diabetes - 2011,Diabetes Care,2011,34  (Suppl 1):S11-S61.  08/23/2007 02:40 PM (H)  Final   7.4 (NOTE)   The ADA recommends the following therapeutic goals for glycemic   control related to Hgb A1C measurement:   Goal of Therapy:   < 7.0% Hgb A1C   Action Suggested:  > 8.0% Hgb A1C   Ref:  Diabetes Care, 22, Suppl. 1, 1999    CBG: No results for input(s): GLUCAP in the last 168 hours.     Past Medical History  She,  has a past medical history of Anemia, Hyperlipidemia, Hypertension, MI (myocardial infarction) (HCC), and Obesity.   Surgical History    Past Surgical History:  Procedure Laterality Date  . CARDIAC CATHETERIZATION    . TUBAL LIGATION       Social History   reports that she has never smoked. She has never used smokeless tobacco. She reports that she does not drink alcohol and does not use drugs.   Family History    Her family history includes Colon cancer (age of onset: 64) in her brother; Colon cancer (age of onset: 70) in her mother; Diabetes in her sister; Heart attack (age of onset: 56) in her father; Hypertension in her brother, sister, and sister; Stroke in her father.   Allergies No Known Allergies   Home Medications  Prior to Admission medications   Medication Sig Start Date End Date Taking? Authorizing Provider  aspirin 81 MG chewable tablet Chew by mouth daily.   Yes [provider]  diclofenac (CATAFLAM) 50 MG tablet Take 1 tablet (50 mg total) by mouth 3 (three) times daily for 20 days. 04/07/20 04/27/20 Yes Avegno, Zachery Dakins, FNP  diclofenac Sodium (VOLTAREN) 1 % GEL Apply 2 g topically 4 (four) times daily.   Yes [provider]  ferrous sulfate 325 (65 FE) MG tablet Take 325 mg by mouth daily.   Yes [provider]  folic acid (FOLVITE) 1 MG tablet Take 1 mg by mouth daily.   Yes [provider]  labetalol (NORMODYNE) 200 MG tablet Take 200 mg by mouth 2 (two) times daily.   Yes [provider]  nitroGLYCERIN (NITROSTAT) 0.4 MG SL tablet Place 1 tablet under the tongue daily as needed.   Yes [provider]  amLODipine (NORVASC) 10 MG tablet Take 1 tablet (10 mg total) by mouth daily. Patient not taking: Reported on 04/16/2020 07/21/18  Mesner, Barbara Cower, MD  HYDROcodone-acetaminophen (NORCO/VICODIN) 5-325 MG tablet Take 1 tablet by mouth every 4 (four) hours as needed. Patient not taking: Reported on 04/16/2020 12/14/18   Raeford Razor, MD  levofloxacin (LEVAQUIN) 500 MG tablet Take 1 tablet (500 mg total) by mouth daily. Patient not taking: Reported on 04/16/2020 12/14/18   Raeford Razor, MD  lisinopril (PRINIVIL,ZESTRIL) 10 MG tablet Take 1 tablet (10 mg total) by mouth daily. Patient not taking: Reported on 04/16/2020 07/21/18   Mesner, Barbara Cower, MD  predniSONE (DELTASONE) 10 MG tablet Take 2 tablets (20 mg total) by mouth daily. Patient not  taking: Reported on 04/16/2020 04/04/20   Durward Parcel, FNP      The patient is critically ill with multiple organ systems failure and requires high complexity decision making for assessment and support, frequent evaluation and titration of therapies, application of advanced monitoring technologies and extensive interpretation of multiple databases. Critical Care Time devoted to patient care services described in this note is 45 minutes.    Sandrea Hughs, MD Pulmonary and Critical Care Medicine [facility] Cell [phone]   After 7:00 pm call Elink  [phone]

## 2020-04-16 NOTE — Progress Notes (Addendum)
Clarified with patient upon arrival to unit, patient states that she wants to be full code and would want all life saving measures including intubation to be taken if necessary. Preference for code status witnessed by 3 RN's, RRT and AC. Patient has axillary temperature of 102, no PRN tylenol available. Patient also restless on bipap with increased respirations. MD aware of all of the above.

## 2020-04-16 NOTE — Progress Notes (Signed)
Tried patient on heated high flow, patient could not tolerate complaining of smell. Left high flow running as it does have slight smell when first started. Will try again later Patient still on NRB 15 liters.

## 2020-04-16 NOTE — ED Notes (Signed)
Lab in room.

## 2020-04-16 NOTE — Progress Notes (Signed)
eLink Physician-Brief Progress Note Patient Name: Frances Miller DOB: 1956/06/17 MRN: [id number]   Date of Service  04/16/2020  HPI/Events of Note  Patient admitted by the Hospitalist service with bilateral lung infiltrates and hypoxemic respiratory failure on BIPAP.  eICU Interventions  Patient is on BIPAP at the moment with saturation of 96 %, however there is increased work of breathing, plan is to monitor patient for material deterioration necessitating PCCM intervention.        Thomasene Lot Ogan 04/16/2020, 11:24 PM

## 2020-04-16 NOTE — ED Notes (Signed)
Pt was given breakfast tray 

## 2020-04-16 NOTE — ED Notes (Signed)
Pt previously maintaining on 15L HFNC. hpwever pts sats began decreasing back to the 80s. RN to room to assess. Pt placed on NRB per EDP. EDP advised to call respiratory to place on Bipap Hospitalist notified.

## 2020-04-16 NOTE — Progress Notes (Signed)
Patient transferred from 3rd floor to ICU placed on BiPAP.

## 2020-04-16 NOTE — H&P (Signed)
TRH H&P    Patient Demographics:    Frances Miller, is a 64 y.o. female  MRN: [id number]  DOB - May 08, 1956  Admit Date - 2020-04-17  Referring MD/NP/PA: Dr. Preston Fleeting  Outpatient Primary MD for the patient is Carylon Perches, MD  Patient coming from: Home  Chief complaint-dyspnea and fatigue   HPI:    Frances Miller  is a 64 y.o. female, with history of obesity, myocardial infarction, hypertension, hyperlipidemia, and anemia presents to the ER with 2 days of feeling weak tired and short of breath.  Patient reports that the symptoms started suddenly on Wednesday while she was at work.  She felt so tired that she could not walk and asked her boss if she could just go home.  When she got home she just sat on the couch and did not do anything.  Her shortness of breath seemed unchanged by exertion.  But her weakness and fatigue was worse with exertion.  She had no fevers and no chills at home.  She did have an increase in cough, productive of white phlegm.  She did not see any blood in her sputum.  Patient did not try any medications to alleviate her symptoms.  She reports that she did have pneumonia before.  It was several years ago.  At that time she did not have to be hospitalized and was treated outpatient.  Of note patient is scared to death.  She is very tearful, and while her respiratory status declines, patient reports that she would like to go home as soon as possible.  Patient is having no chest pain, no palpitations.  Patient does not have a diagnosis of cancer, she is not on hormones, she has not taken any long trips recently.  She has never had a blood clot.  She has no unilateral leg swelling.  She has not been immobilized.  Patient also has had no recent surgeries.  Patient has no other complaints at this time.  ED highlights Fever of 101.1, white blood cell count of 14.1, heart rate of 108 Bilateral interstitial  and groundglass opacities most compatible with atypical infection including COVID-19.  Other atypical organisms can give similar pattern of infection as can noninfectious causes such as hypersensitivity pneumonitis. -Patient denies any new exposures. She was started on Zithromax and Rocephin in the ED.    Review of systems:    In addition to the HPI above,  No Fever-chills, No Headache, No changes with Vision or hearing, No problems swallowing food or Liquids, No Chest pain, positive for cough and shortness of Breath, No Abdominal pain, No Nausea or Vomiting, bowel movements are regular, No Blood in stool or Urine, No dysuria, No new skin rashes or bruises, No new joints pains-aches,  No new asymmetrical weakness, tingling, numbness in any extremity, No recent weight gain or loss, No polyuria, polydypsia or polyphagia, No significant Mental Stressors.  All other systems reviewed and are negative.    Past History of the following :    Past Medical History:  Diagnosis Date  .  Anemia   . Hyperlipidemia   . Hypertension   . MI (myocardial infarction) (HCC)   . Obesity       Past Surgical History:  Procedure Laterality Date  . CARDIAC CATHETERIZATION    . TUBAL LIGATION        Social History:      Social History   Tobacco Use  . Smoking status: Never Smoker  . Smokeless tobacco: Never Used  Substance Use Topics  . Alcohol use: No       Family History :     Family History  Problem Relation Age of Onset  . Stroke Father   . Heart attack Father [id number]  . Colon cancer Mother 1879  . Hypertension Sister   . Diabetes Sister   . Hypertension Brother   . Hypertension Sister   . Colon cancer Brother 50       unsure what type of cancer      Home Medications:   Prior to Admission medications   Medication Sig Start Date End Date Taking? Authorizing Provider  amLODipine (NORVASC) 10 MG tablet Take 1 tablet (10 mg total) by mouth daily. 07/21/18   Mesner, Barbara CowerJason, MD   aspirin 81 MG chewable tablet Chew by mouth daily.    [provider]  diclofenac (CATAFLAM) 50 MG tablet Take 1 tablet (50 mg total) by mouth 3 (three) times daily for 20 days. 04/07/20 04/27/20  Avegno, Zachery DakinsKomlanvi S, FNP  ferrous sulfate 325 (65 FE) MG tablet Take 325 mg by mouth daily.    [provider]  folic acid (FOLVITE) 1 MG tablet Take 1 mg by mouth daily.    [provider]  HYDROcodone-acetaminophen (NORCO/VICODIN) 5-325 MG tablet Take 1 tablet by mouth every 4 (four) hours as needed. 12/14/18   Raeford RazorKohut, Stephen, MD  labetalol (NORMODYNE) 200 MG tablet Take 200 mg by mouth 2 (two) times daily.    [provider]  levofloxacin (LEVAQUIN) 500 MG tablet Take 1 tablet (500 mg total) by mouth daily. 12/14/18   Raeford RazorKohut, Stephen, MD  lisinopril (PRINIVIL,ZESTRIL) 10 MG tablet Take 1 tablet (10 mg total) by mouth daily. 07/21/18   Mesner, Barbara CowerJason, MD  predniSONE (DELTASONE) 10 MG tablet Take 2 tablets (20 mg total) by mouth daily. 04/04/20   Avegno, Zachery DakinsKomlanvi S, FNP     Allergies:    No Known Allergies   Physical Exam:   Vitals  Blood pressure (!) 143/74, pulse (!) 101, temperature 98.5 F (36.9 C), temperature source Oral, resp. rate (!) 35, height 5' (1.524 m), weight 57.2 kg, SpO2 98 %.  1.  General: Lying supine in bed, anxious, tearful  2. Psychiatric: Mood is anxious  3. Neurologic: Cranial nerves II through XII are grossly intact, patient moves all 4 extremities voluntarily, no focal deficit on limited exam  4. HEENMT:  Head is atraumatic normocephalic pupils are reactive to light neck is supple trachea is midline mucous membranes are moist  5. Respiratory : Coarse breath sounds bilaterally Auscultation limited by sounds of BiPAP  6. Cardiovascular : Heart rate is tachycardic rhythm is regular  7. Gastrointestinal:  Abdomen is soft, nondistended, nontender to palpation  8. Skin:  No rashes or acute lesions on limited skin  exam  9.Musculoskeletal:  No peripheral edema    Data Review:    CBC Recent Labs  Lab 05/12/2020 2352  WBC 14.1*  HGB 8.7*  HCT 27.4*  PLT 255  MCV 82.3  MCH 26.1  MCHC 31.8  RDW  16.1*  LYMPHSABS 0.5*  MONOABS 1.1*  EOSABS 0.1  BASOSABS 0.0   ------------------------------------------------------------------------------------------------------------------  Results for orders placed or performed during the hospital encounter of 10-May-2020 (from the past 48 hour(s))  Lactic acid, plasma     Status: Abnormal   Collection Time: May 10, 2020 11:52 PM  Result Value Ref Range   Lactic Acid, Venous 2.8 (HH) 0.5 - 1.9 mmol/L    Comment: CRITICAL RESULT CALLED TO, READ BACK BY AND VERIFIED WITH: B NORMAN,RN@[id number]  04/16/20 MKELLY Performed at [facility], [address] [place], [place] [address]   Comprehensive metabolic panel     Status: Abnormal   Collection Time: May 10, 2020 11:52 PM  Result Value Ref Range   Sodium 133 (L) 135 - 145 mmol/L   Potassium 3.6 3.5 - 5.1 mmol/L   Chloride 100 98 - 111 mmol/L   CO2 20 (L) 22 - 32 mmol/L   Glucose, Bld 278 (H) 70 - 99 mg/dL    Comment: Glucose reference range applies only to samples taken after fasting for at least 8 hours.   BUN 32 (H) 8 - 23 mg/dL   Creatinine, Ser 1.75 (H) 0.44 - 1.00 mg/dL   Calcium 8.1 (L) 8.9 - 10.3 mg/dL   Total Protein 6.7 6.5 - 8.1 g/dL   Albumin 2.6 (L) 3.5 - 5.0 g/dL   AST 34 15 - 41 U/L   ALT 51 (H) 0 - 44 U/L   Alkaline Phosphatase 183 (H) 38 - 126 U/L   Total Bilirubin 1.0 0.3 - 1.2 mg/dL   GFR calc non Af Amer 29 (L) >60 mL/min   GFR calc Af Amer 34 (L) >60 mL/min   Anion gap 13 5 - 15    Comment: Performed at [facility], [address] [place], [place] [address]  CBC WITH DIFFERENTIAL     Status: Abnormal   Collection Time: May 10, 2020 11:52 PM  Result Value Ref Range   WBC 14.1 (H) 4.0 - 10.5 K/uL   RBC 3.33 (L) 3.87 - 5.11 MIL/uL   Hemoglobin 8.7 (L) 12.0 - 15.0 g/dL   HCT 52.7 (L) 36 - 46 %    MCV 82.3 80.0 - 100.0 fL   MCH 26.1 26.0 - 34.0 pg   MCHC 31.8 30.0 - 36.0 g/dL   RDW 78.2 (H) 42.3 - 53.6 %   Platelets 255 150 - 400 K/uL   nRBC 0.0 0.0 - 0.2 %   Neutrophils Relative % 87 %   Neutro Abs 12.3 (H) 1.7 - 7.7 K/uL   Lymphocytes Relative 3 %   Lymphs Abs 0.5 (L) 0.7 - 4.0 K/uL   Monocytes Relative 7 %   Monocytes Absolute 1.1 (H) 0 - 1 K/uL   Eosinophils Relative 1 %   Eosinophils Absolute 0.1 0 - 0 K/uL   Basophils Relative 0 %   Basophils Absolute 0.0 0 - 0 K/uL   Immature Granulocytes 2 %   Abs Immature Granulocytes 0.21 (H) 0.00 - 0.07 K/uL    Comment: Performed at [facility], [address], [place], [place] [address]  APTT     Status: Abnormal   Collection Time: 2020-05-10 11:52 PM  Result Value Ref Range   aPTT 43 (H) 24 - 36 seconds    Comment:        IF BASELINE aPTT IS ELEVATED, SUGGEST PATIENT RISK ASSESSMENT BE USED TO DETERMINE APPROPRIATE ANTICOAGULANT THERAPY. Performed at [facility], [address] [place], [place] [address]   Protime-INR  Status: Abnormal   Collection Time: 05/10/2020 11:52 PM  Result Value Ref Range   Prothrombin Time 17.7 (H) 11.4 - 15.2 seconds   INR 1.5 (H) 0.8 - 1.2    Comment: (NOTE) INR goal varies based on device and disease states. Performed at [facility], [address] [place], [place] [address]   SARS Coronavirus 2 by RT PCR (hospital order, performed in [facility] hospital lab) Nasopharyngeal Nasopharyngeal Swab     Status: None   Collection Time: 04/16/20 12:52 AM   Specimen: Nasopharyngeal Swab  Result Value Ref Range   SARS Coronavirus 2 NEGATIVE NEGATIVE    Comment: (NOTE) SARS-CoV-2 target nucleic acids are NOT DETECTED.  The SARS-CoV-2 RNA is generally detectable in upper and lower respiratory specimens during the acute phase of infection. The lowest concentration of SARS-CoV-2 viral copies this assay can detect is 250 copies / mL. A negative result does not preclude SARS-CoV-2 infection and  should not be used as the sole basis for treatment or other patient management decisions.  A negative result may occur with improper specimen collection / handling, submission of specimen other than nasopharyngeal swab, presence of viral mutation(s) within the areas targeted by this assay, and inadequate number of viral copies (<250 copies / mL). A negative result must be combined with clinical observations, patient history, and epidemiological information.  Fact Sheet for Patients:   BoilerBrush.com.cy  Fact Sheet for Healthcare Providers: https://pope.com/  This test is not yet approved or  cleared by the [place] FDA and has been authorized for detection and/or diagnosis of SARS-CoV-2 by FDA under an Emergency Use Authorization (EUA).  This EUA will remain in effect (meaning this test can be used) for the duration of the COVID-19 declaration under Section 564(b)(1) of the Act, 21 U.S.C. section 360bbb-3(b)(1), unless the authorization is terminated or revoked sooner.  Performed at [facility], [address] [place], [place] [address]   Lactic acid, plasma     Status: Abnormal   Collection Time: 04/16/20  2:01 AM  Result Value Ref Range   Lactic Acid, Venous 2.0 (HH) 0.5 - 1.9 mmol/L    Comment: CRITICAL VALUE NOTED.  VALUE IS CONSISTENT WITH PREVIOUSLY REPORTED AND CALLED VALUE. Performed at [facility], [address], [place], [place] [address]     Chemistries  Recent Labs  Lab 04/16/2020 2352  NA 133*  K 3.6  CL 100  CO2 20*  GLUCOSE 278*  BUN 32*  CREATININE 1.79*  CALCIUM 8.1*  AST 34  ALT 51*  ALKPHOS 183*  BILITOT 1.0   ------------------------------------------------------------------------------------------------------------------  ------------------------------------------------------------------------------------------------------------------ GFR: Estimated Creatinine Clearance: 25.2 mL/min (A) (by  C-G formula based on SCr of 1.79 mg/dL (H)). Liver Function Tests: Recent Labs  Lab 04/19/2020 2352  AST 34  ALT 51*  ALKPHOS 183*  BILITOT 1.0  PROT 6.7  ALBUMIN 2.6*   No results for input(s): LIPASE, AMYLASE in the last 168 hours. No results for input(s): AMMONIA in the last 168 hours. Coagulation Profile: Recent Labs  Lab 04/22/2020 2352  INR 1.5*   Cardiac Enzymes: No results for input(s): CKTOTAL, CKMB, CKMBINDEX, TROPONINI in the last 168 hours. BNP (last 3 results) No results for input(s): PROBNP in the last 8760 hours. HbA1C: No results for input(s): HGBA1C in the last 72 hours. CBG: No results for input(s): GLUCAP in the last 168 hours. Lipid Profile: No results for input(s): CHOL, HDL, LDLCALC, TRIG, CHOLHDL, LDLDIRECT in the last 72 hours. Thyroid Function Tests: No results  for input(s): TSH, T4TOTAL, FREET4, T3FREE, THYROIDAB in the last 72 hours. Anemia Panel: No results for input(s): VITAMINB12, FOLATE, FERRITIN, TIBC, IRON, RETICCTPCT in the last 72 hours.  --------------------------------------------------------------------------------------------------------------- Urine analysis: No results found for: COLORURINE, APPEARANCEUR, LABSPEC, PHURINE, GLUCOSEU, HGBUR, BILIRUBINUR, KETONESUR, PROTEINUR, UROBILINOGEN, NITRITE, LEUKOCYTESUR    Imaging Results:    DG Chest Port 1 View  Result Date: 04/16/2020 CLINICAL DATA:  Fever, cough EXAM: PORTABLE CHEST 1 VIEW COMPARISON:  12/14/2018 FINDINGS: 2 frontal views of the chest demonstrate an unremarkable cardiac silhouette. There are diffuse interstitial and ground-glass opacities throughout the lungs bilaterally. No effusion or pneumothorax. No acute bony abnormalities. IMPRESSION: 1. Bilateral interstitial and ground-glass opacities most compatible with atypical infection, including COVID-19. Other atypical organisms can give a similar pattern of infection, as can non infectious causes such as hypersensitivity  pneumonitis. Electronically Signed   By: Sharlet Salina M.D.   On: 04/16/2020 00:43    My personal review of EKG: RhythmST, Rate 116 /min, QTc 495 ,no Acute ST changes   Assessment & Plan:    Active Problems:   Sepsis (HCC)   1. Acute hypoxic respiratory failure 1. Baseline room air 2. Now requiring BiPAP 3. Chest x-ray shows community-acquired pneumonia 4. Troponin pending 5. Patient cannot have CTA secondary to AKI, VQ scan today - tachycardic and hypoxic 6. EKG does show inverted T waves in V1 V2 V3 7. Troponin pending 8. Monitor in ICU 2. Community-acquired pneumonia 1. Continue Rocephin and Zithromax 2. Covid negative 3. Continue to monitor 3. Sepsis secondary to community-acquired pneumonia 1. With tachycardia, leukocytosis, fever, sources pneumonia 2. Lactic acidosis of 2.6 and AKI 3. Continue Rocephin and Zithromax 4. Patient received 1.75 L of normal saline 5. Blood cultures and sputum culture pending 6. Continue to monitor 4. Lactic acidosis 1. 1.75 L sepsis bolus in ED 2. Continue 75 mL/h 3. Lactic acid is trended from 2.6-2.0 continue to trend 4. Continue to monitor 5. AKI 1. Baseline creatinine is 0.89 2. Today creatinine is 1.79 3. Fluids as above 4. Likely prerenal in the setting of sepsis 6.    DVT Prophylaxis-   Heparin- SCDs   AM Labs Ordered, also please review Full Orders  Family Communication: No family at bedside Code Status: Full code  Admission status: Inpatient :The appropriate admission status for this patient is INPATIENT. Inpatient status is judged to be reasonable and necessary in order to provide the required intensity of service to ensure the patient's safety. The patient's presenting symptoms, physical exam findings, and initial radiographic and laboratory data in the context of their chronic comorbidities is felt to place them at high risk for further clinical deterioration. Furthermore, it is not anticipated that the patient will be  medically stable for discharge from the hospital within 2 midnights of admission. The following factors support the admission status of inpatient.     The patient's presenting symptoms include shortness of breath and fatigue The worrisome physical exam findings include tachycardia, fever. The initial radiographic and laboratory data are worrisome because of leukocytosis, AKI, lactic acidosis. The chronic co-morbidities include as above.       * I certify that at the point of admission it is my clinical judgment that the patient will require inpatient hospital care spanning beyond 2 midnights from the point of admission due to high intensity of service, high risk for further deterioration and high frequency of surveillance required.*  Time spent in minutes : 68   Charle Clear B Zierle-Ghosh DO

## 2020-04-16 NOTE — ED Notes (Signed)
Non rebreather applied at 15 lpm

## 2020-04-16 NOTE — Progress Notes (Signed)
**Note De-Identified Frances Miller Obfuscation** Patient escorted to Radiology by RRT on 100% NRB with SAT 84% for scan. Patient tolerated well. Patient placed back onto 15 L HFNC after procedure, RT to continue to monitor.

## 2020-04-16 NOTE — Progress Notes (Signed)
Per HPI: Frances Miller  is a 64 y.o. female, with history of obesity, myocardial infarction, hypertension, hyperlipidemia, and anemia presents to the ER with 2 days of feeling weak tired and short of breath.  Patient reports that the symptoms started suddenly on Wednesday while she was at work.  She felt so tired that she could not walk and asked her boss if she could just go home.  When she got home she just sat on the couch and did not do anything.  Her shortness of breath seemed unchanged by exertion.  But her weakness and fatigue was worse with exertion.  She had no fevers and no chills at home.  She did have an increase in cough, productive of white phlegm.  She did not see any blood in her sputum.  Patient did not try any medications to alleviate her symptoms.  She reports that she did have pneumonia before.  It was several years ago.  At that time she did not have to be hospitalized and was treated outpatient.  Of note patient is scared to death.  She is very tearful, and while her respiratory status declines, patient reports that she would like to go home as soon as possible.  Patient is having no chest pain, no palpitations.  Patient does not have a diagnosis of cancer, she is not on hormones, she has not taken any long trips recently.  She has never had a blood clot.  She has no unilateral leg swelling.  She has not been immobilized.  Patient also has had no recent surgeries.  Patient has no other complaints at this time.  ED highlights Fever of 101.1, white blood cell count of 14.1, heart rate of 108 Bilateral interstitial and groundglass opacities most compatible with atypical infection including COVID-19.  Other atypical organisms can give similar pattern of infection as can noninfectious causes such as hypersensitivity pneumonitis. -Patient denies any new exposures. She was started on Zithromax and Rocephin in the ED.  -Patient was admitted with acute hypoxemic respiratory failure in the  setting of sepsis and community-acquired pneumonia with associated lactic acidosis as well as AKI.  Her Covid testing has returned negative and there is some concern that there is may be a false negative.  She has been seen by pulmonology with addition of vancomycin noted and knee x-rays obtained which demonstrate some mild soft tissue swelling, but no other acute findings.  Plan to continue otherwise on Rocephin and azithromycin.  VQ scan is negative for PE.  Plan to repeat Covid testing one more time to ensure that this is indeed negative.  Total care time: 30 minutes.

## 2020-04-16 NOTE — Progress Notes (Signed)
Tried patient on heated high flow 100 flow 30 patients saturation dropped in 70 -- add nrb 100 15 liters back patients oxygen claimed in to 90's. Presently on both heated and nrb . f about 40.

## 2020-04-17 ENCOUNTER — Inpatient Hospital Stay (HOSPITAL_COMMUNITY): Payer: BC Managed Care – PPO

## 2020-04-17 DIAGNOSIS — R748 Abnormal levels of other serum enzymes: Secondary | ICD-10-CM

## 2020-04-17 DIAGNOSIS — J189 Pneumonia, unspecified organism: Secondary | ICD-10-CM

## 2020-04-17 DIAGNOSIS — R7401 Elevation of levels of liver transaminase levels: Secondary | ICD-10-CM

## 2020-04-17 LAB — BLOOD GAS, ARTERIAL
Acid-base deficit: 2 mmol/L (ref 0.0–2.0)
Acid-base deficit: 3.1 mmol/L — ABNORMAL HIGH (ref 0.0–2.0)
Acid-base deficit: 1.3 mmol/L (ref 0.0–2.0)
Acid-base deficit: 3 mmol/L — ABNORMAL HIGH (ref 0.0–2.0)
Bicarbonate: 22.6 mmol/L (ref 20.0–28.0)
Bicarbonate: 21.7 mmol/L (ref 20.0–28.0)
Bicarbonate: 23.3 mmol/L (ref 20.0–28.0)
Bicarbonate: 21.6 mmol/L (ref 20.0–28.0)
FIO2: 75
FIO2: 100
FIO2: 75
FIO2: 100
O2 Saturation: 93.5 %
O2 Saturation: 96.8 %
O2 Saturation: 90.8 %
O2 Saturation: 98.8 %
Patient temperature: 38
Patient temperature: 37
Patient temperature: 37.4
Patient temperature: 103.3
pCO2 arterial: 41.8 mmHg (ref 32.0–48.0)
pCO2 arterial: 40.9 mmHg (ref 32.0–48.0)
pCO2 arterial: 35.6 mmHg (ref 32.0–48.0)
pCO2 arterial: 39.1 mmHg (ref 32.0–48.0)
pH, Arterial: 7.356 (ref 7.350–7.450)
pH, Arterial: 7.344 — ABNORMAL LOW (ref 7.350–7.450)
pH, Arterial: 7.418 (ref 7.350–7.450)
pH, Arterial: 7.361 (ref 7.350–7.450)
pO2, Arterial: 78.9 mmHg — ABNORMAL LOW (ref 83.0–108.0)
pO2, Arterial: 97.5 mmHg (ref 83.0–108.0)
pO2, Arterial: 64 mmHg — ABNORMAL LOW (ref 83.0–108.0)
pO2, Arterial: 140 mmHg — ABNORMAL HIGH (ref 83.0–108.0)

## 2020-04-17 LAB — GLUCOSE, CAPILLARY
Glucose-Capillary: 153 mg/dL — ABNORMAL HIGH (ref 70–99)
Glucose-Capillary: 130 mg/dL — ABNORMAL HIGH (ref 70–99)
Glucose-Capillary: 119 mg/dL — ABNORMAL HIGH (ref 70–99)

## 2020-04-17 LAB — BODY FLUID CELL COUNT WITH DIFFERENTIAL
Eos, Fluid: 1 %
Lymphs, Fluid: 6 %
Monocyte-Macrophage-Serous Fluid: 9 % — ABNORMAL LOW (ref 50–90)
Neutrophil Count, Fluid: 84 % — ABNORMAL HIGH (ref 0–25)
Other Cells, Fluid: 0 %
Total Nucleated Cell Count, Fluid: 546 cu mm (ref 0–1000)

## 2020-04-17 LAB — COMPREHENSIVE METABOLIC PANEL
ALT: 52 U/L — ABNORMAL HIGH (ref 0–44)
AST: 49 U/L — ABNORMAL HIGH (ref 15–41)
Albumin: 2.1 g/dL — ABNORMAL LOW (ref 3.5–5.0)
Alkaline Phosphatase: 159 U/L — ABNORMAL HIGH (ref 38–126)
Anion gap: 11 (ref 5–15)
BUN: 26 mg/dL — ABNORMAL HIGH (ref 8–23)
CO2: 23 mmol/L (ref 22–32)
Calcium: 8 mg/dL — ABNORMAL LOW (ref 8.9–10.3)
Chloride: 106 mmol/L (ref 98–111)
Creatinine, Ser: 1.17 mg/dL — ABNORMAL HIGH (ref 0.44–1.00)
GFR calc Af Amer: 57 mL/min — ABNORMAL LOW (ref >60)
GFR calc non Af Amer: 49 mL/min — ABNORMAL LOW (ref >60)
Glucose, Bld: 157 mg/dL — ABNORMAL HIGH (ref 70–99)
Potassium: 4.1 mmol/L (ref 3.5–5.1)
Sodium: 140 mmol/L (ref 135–145)
Total Bilirubin: 0.9 mg/dL (ref 0.3–1.2)
Total Protein: 6 g/dL — ABNORMAL LOW (ref 6.5–8.1)

## 2020-04-17 LAB — CBC
HCT: 24.4 % — ABNORMAL LOW (ref 36.0–46.0)
Hemoglobin: 7.4 g/dL — ABNORMAL LOW (ref 12.0–15.0)
MCH: 26 pg (ref 26.0–34.0)
MCHC: 30.3 g/dL (ref 30.0–36.0)
MCV: 85.6 fL (ref 80.0–100.0)
Platelets: 256 10*3/uL (ref 150–400)
RBC: 2.85 MIL/uL — ABNORMAL LOW (ref 3.87–5.11)
RDW: 16.6 % — ABNORMAL HIGH (ref 11.5–15.5)
WBC: 17.2 10*3/uL — ABNORMAL HIGH (ref 4.0–10.5)
nRBC: 0 % (ref 0.0–0.2)

## 2020-04-17 LAB — MRSA PCR SCREENING: MRSA by PCR: NEGATIVE

## 2020-04-17 LAB — MAGNESIUM
Magnesium: 1.8 mg/dL (ref 1.7–2.4)
Magnesium: 2.2 mg/dL (ref 1.7–2.4)

## 2020-04-17 LAB — PROCALCITONIN: Procalcitonin: 6.54 ng/mL

## 2020-04-17 LAB — SEDIMENTATION RATE: Sed Rate: >140 mm/hr — ABNORMAL HIGH (ref 0–22)

## 2020-04-17 LAB — LACTIC ACID, PLASMA: Lactic Acid, Venous: 1.8 mmol/L (ref 0.5–1.9)

## 2020-04-17 LAB — PHOSPHORUS: Phosphorus: 5.4 mg/dL — ABNORMAL HIGH (ref 2.5–4.6)

## 2020-04-17 LAB — TRIGLYCERIDES: Triglycerides: 220 mg/dL — ABNORMAL HIGH (ref <150)

## 2020-04-17 LAB — C-REACTIVE PROTEIN: CRP: 39.8 mg/dL — ABNORMAL HIGH (ref <1.0)

## 2020-04-17 MED ORDER — VANCOMYCIN HCL 500 MG/100ML IV SOLN
500.0000 mg | Freq: Two times a day (BID) | INTRAVENOUS | Status: DC
  Administered 2020-04-17: 500 mg via INTRAVENOUS
  Filled 2020-04-17 (×2): qty 100

## 2020-04-17 MED ORDER — ROCURONIUM BROMIDE 10 MG/ML (PF) SYRINGE
PREFILLED_SYRINGE | INTRAVENOUS | Status: AC
  Filled 2020-04-17: qty 10

## 2020-04-17 MED ORDER — ARTIFICIAL TEARS OPHTHALMIC OINT
1.0000 "application " | TOPICAL_OINTMENT | Freq: Three times a day (TID) | OPHTHALMIC | Status: DC
  Administered 2020-04-17: 1 via OPHTHALMIC
  Filled 2020-04-17: qty 3.5

## 2020-04-17 MED ORDER — NOREPINEPHRINE 4 MG/250ML-% IV SOLN
0.0000 ug/min | INTRAVENOUS | Status: DC
  Administered 2020-04-17: 2 ug/min via INTRAVENOUS

## 2020-04-17 MED ORDER — FREE WATER
200.0000 mL | Status: DC
  Administered 2020-04-17 – 2020-04-18 (×2): 200 mL

## 2020-04-17 MED ORDER — MIDAZOLAM 50MG/50ML (1MG/ML) PREMIX INFUSION
2.0000 mg/h | INTRAVENOUS | Status: DC
  Administered 2020-04-17: 2 mg/h via INTRAVENOUS
  Filled 2020-04-17 (×2): qty 50

## 2020-04-17 MED ORDER — FENTANYL CITRATE (PF) 100 MCG/2ML IJ SOLN: 50.0000 ug | Freq: Once | INTRAMUSCULAR | Status: DC

## 2020-04-17 MED ORDER — PROPOFOL 1000 MG/100ML IV EMUL
5.0000 ug/kg/min | INTRAVENOUS | Status: DC
  Administered 2020-04-17: 20 ug/kg/min via INTRAVENOUS
  Filled 2020-04-17: qty 100

## 2020-04-17 MED ORDER — VECURONIUM BROMIDE 10 MG IV SOLR
0.1000 mg/kg | INTRAVENOUS | Status: DC | PRN
  Administered 2020-04-17: 6.5 mg via INTRAVENOUS

## 2020-04-17 MED ORDER — VASOPRESSIN 20 UNIT/ML IV SOLN
0.0400 [IU]/min | INTRAVENOUS | Status: DC
  Administered 2020-04-17: 0.03 [IU]/min via INTRAVENOUS
  Filled 2020-04-17: qty 2

## 2020-04-17 MED ORDER — FENTANYL BOLUS VIA INFUSION
50.0000 ug | INTRAVENOUS | Status: DC | PRN
  Filled 2020-04-17: qty 50

## 2020-04-17 MED ORDER — VECURONIUM BROMIDE 10 MG IV SOLR
INTRAVENOUS | Status: AC
  Filled 2020-04-17: qty 10

## 2020-04-17 MED ORDER — ORAL CARE MOUTH RINSE
15.0000 mL | OROMUCOSAL | Status: DC
  Administered 2020-04-17 – 2020-04-18 (×3): 15 mL via OROMUCOSAL

## 2020-04-17 MED ORDER — POLYETHYLENE GLYCOL 3350 17 G PO PACK: 17.0000 g | PACK | Freq: Every day | ORAL | Status: DC

## 2020-04-17 MED ORDER — CHLORHEXIDINE GLUCONATE 0.12% ORAL RINSE (MEDLINE KIT)
15.0000 mL | Freq: Two times a day (BID) | OROMUCOSAL | Status: DC
  Administered 2020-04-17: 15 mL via OROMUCOSAL

## 2020-04-17 MED ORDER — FENTANYL 2500MCG IN NS 250ML (10MCG/ML) PREMIX INFUSION
50.0000 ug/h | INTRAVENOUS | Status: DC
  Administered 2020-04-17: 150 ug/h via INTRAVENOUS

## 2020-04-17 MED ORDER — FENTANYL CITRATE (PF) 100 MCG/2ML IJ SOLN: 25.0000 ug | INTRAMUSCULAR | Status: DC | PRN

## 2020-04-17 MED ORDER — VANCOMYCIN HCL 750 MG/150ML IV SOLN: 750.0000 mg | INTRAVENOUS | Status: DC

## 2020-04-17 MED ORDER — VECURONIUM BROMIDE 10 MG IV SOLR
INTRAVENOUS | Status: AC
  Administered 2020-04-17: 10 mg
  Filled 2020-04-17: qty 10

## 2020-04-17 MED ORDER — LACTATED RINGERS IV SOLN: INTRAVENOUS | Status: DC

## 2020-04-17 MED ORDER — PROPOFOL 1000 MG/100ML IV EMUL
INTRAVENOUS | Status: AC
  Administered 2020-04-17: 40 ug/kg/min via INTRAVENOUS
  Filled 2020-04-17: qty 100

## 2020-04-17 MED ORDER — SODIUM CHLORIDE 0.9 % IV SOLN
2.0000 g | Freq: Two times a day (BID) | INTRAVENOUS | Status: DC
  Administered 2020-04-17 – 2020-04-18 (×2): 2 g via INTRAVENOUS
  Filled 2020-04-17 (×2): qty 2

## 2020-04-17 MED ORDER — PANTOPRAZOLE SODIUM 40 MG IV SOLR
40.0000 mg | Freq: Every day | INTRAVENOUS | Status: DC
  Administered 2020-04-17: 40 mg via INTRAVENOUS
  Filled 2020-04-17: qty 40

## 2020-04-17 MED ORDER — MIDAZOLAM HCL 2 MG/2ML IJ SOLN: 2.0000 mg | INTRAMUSCULAR | Status: DC | PRN

## 2020-04-17 MED ORDER — PROPOFOL 1000 MG/100ML IV EMUL
0.0000 ug/kg/min | INTRAVENOUS | Status: DC
  Administered 2020-04-17: 50 ug/kg/min via INTRAVENOUS

## 2020-04-17 MED ORDER — NOREPINEPHRINE 16 MG/250ML-% IV SOLN
0.0000 ug/min | INTRAVENOUS | Status: DC
  Administered 2020-04-17: 35 ug/min via INTRAVENOUS
  Filled 2020-04-17 (×2): qty 250

## 2020-04-17 MED ORDER — DOCUSATE SODIUM 50 MG/5ML PO LIQD: 100.0000 mg | Freq: Two times a day (BID) | ORAL | Status: DC

## 2020-04-17 MED ORDER — MIDAZOLAM BOLUS VIA INFUSION
1.0000 mg | INTRAVENOUS | Status: DC | PRN
  Administered 2020-04-17: 2 mg via INTRAVENOUS
  Filled 2020-04-17: qty 2

## 2020-04-17 MED ORDER — FENTANYL CITRATE (PF) 100 MCG/2ML IJ SOLN
50.0000 ug | Freq: Once | INTRAMUSCULAR | Status: AC
  Administered 2020-04-17: 50 ug via INTRAVENOUS

## 2020-04-17 MED ORDER — SODIUM CHLORIDE 0.9% FLUSH: 10.0000 mL | INTRAVENOUS | Status: DC | PRN

## 2020-04-17 MED ORDER — VITAL HIGH PROTEIN PO LIQD: 1000.0000 mL | ORAL | Status: DC

## 2020-04-17 MED ORDER — PRO-STAT SUGAR FREE PO LIQD
30.0000 mL | Freq: Two times a day (BID) | ORAL | Status: DC
  Administered 2020-04-17: 30 mL
  Filled 2020-04-17: qty 30

## 2020-04-17 MED ORDER — SODIUM CHLORIDE 0.9% FLUSH
10.0000 mL | Freq: Two times a day (BID) | INTRAVENOUS | Status: DC
  Administered 2020-04-17: 30 mL

## 2020-04-17 MED ORDER — FENTANYL CITRATE (PF) 100 MCG/2ML IJ SOLN
INTRAMUSCULAR | Status: AC
  Administered 2020-04-17: 25 ug via INTRAVENOUS
  Filled 2020-04-17: qty 2

## 2020-04-17 MED ORDER — FENTANYL 2500MCG IN NS 250ML (10MCG/ML) PREMIX INFUSION
50.0000 ug/h | INTRAVENOUS | Status: DC
  Administered 2020-04-17: 50 ug/h via INTRAVENOUS
  Filled 2020-04-17: qty 250

## 2020-04-17 NOTE — ED Provider Notes (Signed)
I was called to intubate the patient because of respiratory distress..  Patient was on BiPAP.  Patient was given 20 of etomidate and initially 100 of succinylcholine and then another 50 of succinylcholine.  Initial attempt at intubation using a glide scope was unsuccessful.  Patient was intubated with direct visualization using a curved blade.  Patient's sats improved some but would not stay up.  It was decided that the tube was malfunctioning possibly the cuff was broken.  The tube was removed.  Glide scope was used again to replace the tube and first attempt was unsuccessful but then we used a bougie with the glide scope and was able to intubate the patient.  This intubation resulted and excellent O2 sats.  Patient's care continued through the hospitalist in the ICU   Bethann Berkshire, MD 04/17/20 1025

## 2020-04-17 NOTE — Procedures (Signed)
Central Venous Catheter Insertion Procedure Note KENLYN LOSE [id number] August 29, 1956  Procedure: Insertion of Central Venous Catheter Indications: Assessment of intravascular volume  Procedure Details Consent: Unable to obtain consent because of emergent medical necessity. Husband did not answer call.  Time Out: Verified patient identification, verified procedure, site/side was marked, verified correct patient position, special equipment/implants available, medications/allergies/relevent history reviewed, required imaging and test results available.  Performed  Maximum sterile technique was used including antiseptics, cap, gloves, gown, hand hygiene, mask and sheet. Skin prep: Chlorhexidine; local anesthetic administered A antimicrobial bonded/coated triple lumen catheter was placed in the left internal jugular vein using the Seldinger technique.  Vessel easily collapsible. Left subclavian attempted, flash of blood but could not pass wire.    Ultrasound was used to verify the patency of the vein and for real time needle guidance.  Evaluation Blood flow good Complications: No apparent complications Patient did tolerate procedure well. Chest X-ray ordered to verify placement.  CXR: pending.  Heber Cleghorn 04/17/2020, 4:52 PM

## 2020-04-17 NOTE — Procedures (Addendum)
PCCM Video Bronchoscopy Procedure Note  The patient was informed of the risks (including but not limited to bleeding, infection, respiratory failure, lung injury, tooth/oral injury) and benefits of the procedure and gave consent, see chart.  Indication: Acute respiratory failure with hypoxemia, community acquired pneumonia of uncertain cause  Post Procedure Diagnosis: same  Location: [facility] ICU  Condition pre procedure: critically ill, on vent  Medications for procedure: propofol infusion, fentanyl infusion  Procedure description: The bronchoscope was introduced through the endotracheal tube and passed to the bilateral lungs to the level of the subsegmental bronchi throughout the tracheobronchial tree.  Airway exam revealed some grey to yellow secretions throughout the tracheobronchial tree which were not obstructing the airways.  Trachea was normal in caliber, carina was sharp, there were no obvious airway masses or lesions. The patient was hypoxemic during the procedure so airway inspection and BAL were performed quickly.  Sequential BAL not performed as no bloody secretions.  Procedures performed: BAL RML 100cc sterile saline injected, 28 cc cloudy return  Specimens sent: BAL: cell count, bacterial, fungal, AFB culture  Condition post procedure: critically ill, on ventilator  EBL: none from procedure  Complications: none immediate  Roselie Awkward, MD [place] P[facility]M Pager: [phone] Cell: [phone] If no response, call [phone]

## 2020-04-17 NOTE — Progress Notes (Signed)
Called to pt bedside for o2 sats 83% on 100% fio2 and +12 peep.  Lung recruitment done x2 minutes.  Pt tolerated well with improvement in o2 sats to 92%. RN aware, at bedside.

## 2020-04-17 NOTE — Progress Notes (Signed)
Patient has not urinated since arrival to unit. Requested foley for critical care need. Patient's respiratory status remains unchanged. Awaiting ABG results.

## 2020-04-17 NOTE — Plan of Care (Signed)
20mg  Etomidate given 0719 100mg  Succinylcholine 0721 50mg  Succinylcholine 

## 2020-04-17 NOTE — Plan of Care (Signed)
Report called to [facility] and [facility] notified of transfer. Awaiting arrival.

## 2020-04-17 NOTE — Progress Notes (Signed)
RT assisted MD with bedside bronch. Pt had no complications.

## 2020-04-17 NOTE — Progress Notes (Signed)
Rn spoke with husband he will be the main person receiving updates and information about the patient daily.

## 2020-04-17 NOTE — Progress Notes (Signed)
eLink Physician-Brief Progress Note Patient Name: Frances Miller DOB: 02-Jan-1956 MRN: [id number]   Date of Service  04/17/2020  HPI/Events of Note  Patient is heavily sedated and paralyzed on the ventilator, she has soft blood pressures despite a Norepinephrine infusion which has been titrated up to close to the ceiling dose.  eICU Interventions  Vasopressin infusion ordered to be used as necessary for hypotension despite Norepinephrine infusion.        Migdalia Dk 04/17/2020, 9:37 PM

## 2020-04-17 NOTE — Progress Notes (Signed)
PROGRESS NOTE    Frances Miller  AXK:[id number] DOB: 1955-10-29 DOA: 04/28/2020 PCP: Asencion Noble, MD   Brief Narrative:  Per HPI: FlorenceParkeris a64 y.o.female,with history of obesity, myocardial infarction, hypertension, hyperlipidemia, and anemia presents to the ER with 2 days of feeling weak tired and short of breath. Patient reports that the symptoms started suddenly on Wednesday while she was at work. She felt so tired that she could not walk and asked her boss if she could just go home. When she got home she just sat on the couch and did not do anything. Her shortness of breath seemed unchanged by exertion. But her weakness and fatigue was worse with exertion. She had no fevers and no chills at home. She did have an increase in cough, productive of white phlegm. She did not see any blood in her sputum. Patient did not try any medications to alleviate her symptoms. She reports that she did have pneumonia before. It was several years ago. At that time she did not have to be hospitalized and was treated outpatient. Of note patient is scared to death. She is very tearful, and while her respiratory status declines, patient reports that she would like to go home as soon as possible.  Patient is having no chest pain, no palpitations. Patient does not have a diagnosis of cancer, she is not on hormones, she has not taken any long trips recently. She has never had a blood clot. She has no unilateral leg swelling. She has not been immobilized. Patient also has had no recent surgeries. Patient has no other complaints at this time.  ED highlights Fever of 101.1, white blood cell count of 14.1, heart rate of 108 Bilateral interstitial and groundglass opacities most compatible with atypical infection including COVID-19. Other atypical organisms can give similar pattern of infection as can noninfectious causes such as hypersensitivity pneumonitis. -Patient denies any new  exposures. She was started on Zithromax and Rocephin in the ED.  -Patient was admitted with acute hypoxemic respiratory failure in the setting of sepsis and community-acquired pneumonia with associated lactic acidosis as well as AKI.    Covid testing has been negative on 2 occasions.  She has been seen by pulmonology with addition of some vancomycin on account of concern for possible knee osteomyelitis.  X-rays demonstrate some mild soft tissue swelling.  VQ scan with no PE noted.  Discussed case with PCCM who will accept in transfer as she has required intubation this morning after failing BiPAP overnight.  CT chest performed with severe diffuse bilateral groundglass opacities noted on 04/17/2020.  Assessment & Plan:   Active Problems:   Sepsis ([place])   Acute hypoxemic respiratory failure (HCC)   Pulmonary infiltrates on CXR   Worsening acute hypoxemic respiratory failure status post intubation -In setting of noted bilateral severe groundglass opacities on imaging with concern for pneumonia -Covid testing x2 negative -Continue antibiotics as prescribed with azithromycin, Rocephin, and vancomycin -VQ scan negative for PE -Discussed case with Dr. Valeta Harms with PCCM who accepts transfer to ICU at [facility] -Follow ESR and CRP  Sepsis secondary to pneumonia -Continue continue Zithromax and increase coverage from Rocephin to cefepime -MRSA negative and I do not suspect osteomyelitis, therefore will discontinue vancomycin -Lactic acidosis improving, however leukocytosis and pro calcitonin upward trending -Continue to monitor with repeat labs  AKI-improving -Baseline creatinine 0.89 -Continue IV fluid hydration and monitor I's and O's -Hold lisinopril and diclofenac -We will hold off on further testing at this time since this  appears to be mostly prerenal  Anemia-downward trending -Appears to be on iron supplementation and folic acid at home -Check anemia panel -Transfuse for hemoglobin less than  7 -Repeat CBC in a.m. -No signs of overt bleeding noted  Mild transaminitis -Continue to monitor closely with repeat labs -Anticipate that much of this is related to her critical illness  Hypoalbuminemia -Likely related to some protein calorie malnutrition  History of hypertension -Hold labetalol in setting of soft blood pressure readings -Hold amlodipine and lisinopril   DVT prophylaxis:Heparin Code Status: Full Family Communication: Tried calling Meridian Scherger (spouse?) with no response Disposition Plan: Transfer to [facility] ICU for further care from PCCM  Status is: Inpatient  Remains inpatient appropriate because:Ongoing diagnostic testing needed not appropriate for outpatient work up, IV treatments appropriate due to intensity of illness or inability to take PO and Inpatient level of care appropriate due to severity of illness   Dispo: The patient is from: Home              Anticipated d/c is to: Home              Anticipated d/c date is: > 3 days              Patient currently is not medically stable to d/c.   Consultants:   Pulmonology  PCCM  Procedures:   Intubation on 04/17/2020  Antimicrobials:  Anti-infectives (From admission, onward)   Start     Dose/Rate Route Frequency Ordered Stop   04/17/20 1600  vancomycin (VANCOREADY) IVPB 500 mg/100 mL  Status:  Discontinued        500 mg 100 mL/hr over 60 Minutes Intravenous Every 24 hours 04/16/20 1440 04/17/20 0837   04/17/20 1600  vancomycin (VANCOREADY) IVPB 750 mg/150 mL     Discontinue     750 mg 150 mL/hr over 60 Minutes Intravenous Every 24 hours 04/17/20 0837     04/16/20 1500  vancomycin (VANCOREADY) IVPB 1500 mg/300 mL        1,500 mg 150 mL/hr over 120 Minutes Intravenous  Once 04/16/20 1439 04/16/20 1720   04/16/20 0015  cefTRIAXone (ROCEPHIN) 2 g in sodium chloride 0.9 % 100 mL IVPB     Discontinue     2 g 200 mL/hr over 30 Minutes Intravenous Every 24 hours 04/16/20 0005     04/16/20 0015   azithromycin (ZITHROMAX) 500 mg in sodium chloride 0.9 % 250 mL IVPB     Discontinue     500 mg 250 mL/hr over 60 Minutes Intravenous Every 24 hours 04/16/20 0005         Subjective: Patient seen and evaluated today with significant respiratory distress and altered mentation on BiPAP.  Objective: Vitals:   04/17/20 1100 04/17/20 1130 04/17/20 1200 04/17/20 1230  BP: (!) 103/58 (!) 123/59 (!) 118/58 (!) 98/52  Pulse: (!) 114 (!) 115 (!) 115 (!) 110  Resp:      Temp:      TempSrc:      SpO2: 92% 94% 94% 94%  Weight:      Height:        Intake/Output Summary (Last 24 hours) at 04/17/2020 1236 Last data filed at 04/17/2020 0802 Gross per 24 hour  Intake 700 ml  Output 225 ml  Net 475 ml   Filed Weights   04/20/2020 2328  Weight: 57.2 kg    Examination:  General exam: Appears to be in moderate to severe distress Respiratory system: Clear to auscultation. Respiratory  effort normal.  Currently on BiPAP 100% FiO2. Cardiovascular system: S1 & S2 heard, RRR, tachycardic. No JVD, murmurs, rubs, gallops or clicks. No pedal edema. Gastrointestinal system: Abdomen is nondistended, soft and nontender. No organomegaly or masses felt. Normal bowel sounds heard. Central nervous system: Arousable to tactile stimulus Extremities: Symmetric 5 x 5 power. Skin: No rashes, lesions or ulcers Psychiatry: Cannot be assessed    Data Reviewed: I have personally reviewed following labs and imaging studies  CBC: Recent Labs  Lab 04/17/2020 2352 04/16/20 0621 04/17/20 0324  WBC 14.1* 14.3* 17.2*  NEUTROABS 12.3* 12.7*  --   HGB 8.7* 8.4* 7.4*  HCT 27.4* 27.2* 24.4*  MCV 82.3 84.5 85.6  PLT 255 245 161   Basic Metabolic Panel: Recent Labs  Lab 04/30/2020 2352 04/16/20 0621 04/17/20 0324  NA 133* 137 140  K 3.6 3.6 4.1  CL 100 102 106  CO2 20* 23 23  GLUCOSE 278* 192* 157*  BUN 32* 32* 26*  CREATININE 1.79* 1.59* 1.17*  CALCIUM 8.1* 8.2* 8.0*  MG  --  1.8 1.8   GFR: Estimated  Creatinine Clearance: 38.5 mL/min (A) (by C-G formula based on SCr of 1.17 mg/dL (H)). Liver Function Tests: Recent Labs  Lab 05/10/2020 2352 04/16/20 0621 04/17/20 0324  AST 34 31 49*  ALT 51* 45* 52*  ALKPHOS 183* 164* 159*  BILITOT 1.0 0.7 0.9  PROT 6.7 6.2* 6.0*  ALBUMIN 2.6* 2.3* 2.1*   No results for input(s): LIPASE, AMYLASE in the last 168 hours. No results for input(s): AMMONIA in the last 168 hours. Coagulation Profile: Recent Labs  Lab 05/05/2020 2352 04/16/20 0621  INR 1.5* 1.6*   Cardiac Enzymes: No results for input(s): CKTOTAL, CKMB, CKMBINDEX, TROPONINI in the last 168 hours. BNP (last 3 results) No results for input(s): PROBNP in the last 8760 hours. HbA1C: No results for input(s): HGBA1C in the last 72 hours. CBG: No results for input(s): GLUCAP in the last 168 hours. Lipid Profile: Recent Labs    04/17/20 0324  TRIG 220*   Thyroid Function Tests: No results for input(s): TSH, T4TOTAL, FREET4, T3FREE, THYROIDAB in the last 72 hours. Anemia Panel: No results for input(s): VITAMINB12, FOLATE, FERRITIN, TIBC, IRON, RETICCTPCT in the last 72 hours. Sepsis Labs: Recent Labs  Lab 05/12/2020 2352 04/16/20 0201 04/16/20 0824 04/17/20 0324  PROCALCITON  --   --  2.07 6.54  LATICACIDVEN 2.8* 2.0*  --  1.8    Recent Results (from the past 240 hour(s))  Blood Culture (routine x 2)     Status: None (Preliminary result)   Collection Time: 05/12/2020 11:52 PM   Specimen: BLOOD  Result Value Ref Range Status   Specimen Description BLOOD RIGHT ANTECUBITAL  Final   Special Requests   Final    BOTTLES DRAWN AEROBIC AND ANAEROBIC Blood Culture adequate volume   Culture   Final    NO GROWTH 1 DAY Performed at [facility] [address], [place], [place] [address]    Report Status PENDING  Incomplete  Blood Culture (routine x 2)     Status: None (Preliminary result)   Collection Time: 04/16/20 12:21 AM   Specimen: BLOOD  Result Value Ref Range Status    Specimen Description BLOOD LEFT ANTECUBITAL  Final   Special Requests   Final    BOTTLES DRAWN AEROBIC AND ANAEROBIC Blood Culture adequate volume   Culture   Final    NO GROWTH 1 DAY Performed at [facility], [place]  [address], [place], [place] [address]    Report Status PENDING  Incomplete  SARS Coronavirus 2 by RT PCR (hospital order, performed in [facility] hospital lab) Nasopharyngeal Nasopharyngeal Swab     Status: None   Collection Time: 04/16/20 12:52 AM   Specimen: Nasopharyngeal Swab  Result Value Ref Range Status   SARS Coronavirus 2 NEGATIVE NEGATIVE Final    Comment: (NOTE) SARS-CoV-2 target nucleic acids are NOT DETECTED.  The SARS-CoV-2 RNA is generally detectable in upper and lower respiratory specimens during the acute phase of infection. The lowest concentration of SARS-CoV-2 viral copies this assay can detect is 250 copies / mL. A negative result does not preclude SARS-CoV-2 infection and should not be used as the sole basis for treatment or other patient management decisions.  A negative result may occur with improper specimen collection / handling, submission of specimen other than nasopharyngeal swab, presence of viral mutation(s) within the areas targeted by this assay, and inadequate number of viral copies (<250 copies / mL). A negative result must be combined with clinical observations, patient history, and epidemiological information.  Fact Sheet for Patients:   StrictlyIdeas.no  Fact Sheet for Healthcare Providers: BankingDealers.co.za  This test is not yet approved or  cleared by the [place] FDA and has been authorized for detection and/or diagnosis of SARS-CoV-2 by FDA under an Emergency Use Authorization (EUA).  This EUA will remain in effect (meaning this test can be used) for the duration of the COVID-19 declaration under Section 564(b)(1) of the Act, 21 U.S.C. section 360bbb-3(b)(1), unless the  authorization is terminated or revoked sooner.  Performed at [facility], [address], [place], [place] [address]   SARS Coronavirus 2 by RT PCR (hospital order, performed in [facility] hospital lab) Nasopharyngeal Nasopharyngeal Swab     Status: None   Collection Time: 04/16/20  4:40 PM   Specimen: Nasopharyngeal Swab  Result Value Ref Range Status   SARS Coronavirus 2 NEGATIVE NEGATIVE Final    Comment: (NOTE) SARS-CoV-2 target nucleic acids are NOT DETECTED.  The SARS-CoV-2 RNA is generally detectable in upper and lower respiratory specimens during the acute phase of infection. The lowest concentration of SARS-CoV-2 viral copies this assay can detect is 250 copies / mL. A negative result does not preclude SARS-CoV-2 infection and should not be used as the sole basis for treatment or other patient management decisions.  A negative result may occur with improper specimen collection / handling, submission of specimen other than nasopharyngeal swab, presence of viral mutation(s) within the areas targeted by this assay, and inadequate number of viral copies (<250 copies / mL). A negative result must be combined with clinical observations, patient history, and epidemiological information.  Fact Sheet for Patients:   StrictlyIdeas.no  Fact Sheet for Healthcare Providers: BankingDealers.co.za  This test is not yet approved or  cleared by the [place] FDA and has been authorized for detection and/or diagnosis of SARS-CoV-2 by FDA under an Emergency Use Authorization (EUA).  This EUA will remain in effect (meaning this test can be used) for the duration of the COVID-19 declaration under Section 564(b)(1) of the Act, 21 U.S.C. section 360bbb-3(b)(1), unless the authorization is terminated or revoked sooner.  Performed at [facility], [address] [place], [place] [address]   MRSA PCR Screening     Status: None   Collection  Time: 04/16/20 10:50 PM   Specimen: Nasal Mucosa; Nasopharyngeal  Result Value Ref Range Status   MRSA by PCR NEGATIVE NEGATIVE Final  Comment:        The GeneXpert MRSA Assay (FDA approved for NASAL specimens only), is one component of a comprehensive MRSA colonization surveillance program. It is not intended to diagnose MRSA infection nor to guide or monitor treatment for MRSA infections. Performed at [facility], [address] [place],  [address]          Radiology Studies: DG Knee 1-2 Views Left  Result Date: 04/16/2020 CLINICAL DATA:  64 year old female with sepsis and left knee pain. No known injury. EXAM: LEFT KNEE - 1-2 VIEW COMPARISON:  Left tib-fib series 04/07/2020. FINDINGS: Portable AP and lateral views. No evidence of left knee joint effusion. Joint spaces and alignment appear stable. No cortical osteolysis identified. No acute osseous abnormality identified. There does appear to be mild medial and anterior soft tissue swelling. IMPRESSION: Possible superficial soft tissue swelling but no evidence of joint effusion or acute osseous abnormality. Electronically Signed   By: Genevie Ann M.D.   On: 04/16/2020 14:26   CT CHEST WO CONTRAST  Result Date: 04/17/2020 CLINICAL DATA:  Hypoxemia EXAM: CT CHEST WITHOUT CONTRAST TECHNIQUE: Multidetector CT imaging of the chest was performed following the standard protocol without IV contrast. COMPARISON:  Same day chest radiograph and CT chest dated 12/14/2018 FINDINGS: Cardiovascular: Vascular calcifications are seen in the coronary arteries and aortic arch. Normal heart size. No pericardial effusion. Mediastinum/Nodes: A few prominent supraclavicular and paratracheal lymph nodes are likely reactive. No enlarged mediastinal or axillary lymph nodes. Thyroid gland and esophagus demonstrate no significant findings. An enteric tube enters the stomach and terminates below the field of view. Lungs/Pleura: The endotracheal tube terminates  1.4 cm from the carina. Severe diffuse bilateral ground-glass opacities, consolidation, and interlobular septal line thickening are noted. There is no pleural effusion or pneumothorax. Upper Abdomen: No acute abnormality. Musculoskeletal: No chest wall mass or suspicious bone lesions identified. IMPRESSION: 1. Severe diffuse bilateral ground-glass opacities, consolidation, and interlobular septal line thickening likely represent pneumonia. Aortic Atherosclerosis (ICD10-I70.0). Electronically Signed   By: Zerita Boers M.D.   On: 04/17/2020 10:57   NM Pulmonary Perfusion  Result Date: 04/16/2020 CLINICAL DATA:  Shortness of breath. EXAM: NUCLEAR MEDICINE PERFUSION LUNG SCAN TECHNIQUE: Perfusion images were obtained in multiple projections after intravenous injection of radiopharmaceutical. Ventilation scans intentionally deferred if perfusion scan and chest x-ray adequate for interpretation during COVID 19 epidemic. RADIOPHARMACEUTICALS:  4.4 mCi Tc-58mMAA IV COMPARISON:  Chest x-ray, same date. FINDINGS: No segmental or subsegmental perfusion defects to suggest pulmonary embolism. IMPRESSION: Negative V/Q scan for pulmonary embolism. Electronically Signed   By: PMarijo SanesM.D.   On: 04/16/2020 12:36   DG Chest Port 1 View  Result Date: 04/17/2020 CLINICAL DATA:  64year old female status post intubation. EXAM: PORTABLE CHEST 1 VIEW COMPARISON:  Chest x-ray 04/16/2020. FINDINGS: An endotracheal tube is in place with tip 1.1 cm above the carina. A nasogastric tube is seen extending into the stomach, however, the tip of the nasogastric tube extends below the lower margin of the image. Worsening patchy multifocal airspace consolidation and diffuse interstitial prominence scattered throughout the lungs bilaterally (right greater than left), most confluent in the right mid lung. No pleural effusions. Heart size appears borderline enlarged. Upper mediastinal contours are within normal limits. IMPRESSION: 1.  Support apparatus, as above. 2. Worsening aeration asymmetrically distributed throughout the lungs bilaterally, concerning for severe multilobar bilateral pneumonia. Electronically Signed   By: DVinnie LangtonM.D.   On: 04/17/2020 08:21   [facility]  [facility]  Result Date: 04/16/2020 CLINICAL DATA:  Fever, cough EXAM: PORTABLE CHEST 1 VIEW COMPARISON:  12/14/2018 FINDINGS: 2 frontal views of the chest demonstrate an unremarkable cardiac silhouette. There are diffuse interstitial and ground-glass opacities throughout the lungs bilaterally. No effusion or pneumothorax. No acute bony abnormalities. IMPRESSION: 1. Bilateral interstitial and ground-glass opacities most compatible with atypical infection, including COVID-19. Other atypical organisms can give a similar pattern of infection, as can non infectious causes such as hypersensitivity pneumonitis. Electronically Signed   By: Randa Ngo M.D.   On: 04/16/2020 00:43        Scheduled Meds: . aspirin  81 mg Oral Daily  . chlorhexidine  15 mL Mouth Rinse BID  . Chlorhexidine Gluconate Cloth  6 each Topical Daily  . feeding supplement (ENSURE ENLIVE)  237 mL Oral BID BM  . ferrous sulfate  325 mg Oral Daily  . folic acid  1 mg Oral Daily  . heparin  5,000 Units Subcutaneous Q8H  . ipratropium  0.5 mg Nebulization Q6H  . labetalol  200 mg Oral BID  . mouth rinse  15 mL Mouth Rinse q12n4p  . pantoprazole (PROTONIX) IV  40 mg Intravenous Daily   Continuous Infusions: . azithromycin 500 mg (04/16/20 2145)  . cefTRIAXone (ROCEPHIN)  IV 2 g (04/16/20 2145)  . lactated ringers    . propofol (DIPRIVAN) infusion 30 mcg/kg/min (04/17/20 0859)  . vancomycin       LOS: 1 day    Total critical care time spent: 55 minutes    Ervie Mccard Darleen Crocker, DO [facility]  If 7PM-7AM, please contact night-coverage [email or web address] 04/17/2020, 12:36 PM

## 2020-04-17 NOTE — H&P (Signed)
NAME:  Frances Miller, MRN:  [id number], DOB:  [birth year], LOS: 1 ADMISSION DATE:  04/27/2020, CONSULTATION DATE:  7/3 REFERRING MD:  Dr. Heath Lark, CHIEF COMPLAINT:  Dyspnea   Brief History   64 year old female presented to [facility] on April 15, 2020 complaining of 10 days of bilateral leg pain followed by acute onset fatigue and shortness of breath with associated fever.  Noted to have bilateral infiltrates.  Covid PCR negative.  Required intubation early a.m. April 17, 2020 for worsening acute respiratory failure with hypoxemia.  Transferred to [facility] long hospital for further management  History of present illness   This is a 64 year old female who was admitted to [facility] on July 1 with severe acute respiratory failure with hypoxemia presumably due to community-acquired pneumonia.  History could not be obtained on admission because the patient was intubated when she arrived to the [facility] long hospital.  History was obtained by speaking with caregivers and reviewing the medical record, see details above.  I try to call her husband today for further history and he did not answer the phone.  From what I gather the patient complained of 10 days of leg pain followed by the onset of fatigue and shortness of breath.  She complained of some cough and mucus production at home.  She did not take any medications at home to alleviate symptoms.  She she told my partner that she had had episodes of pneumonia in the past several years ago.  She has been vaccinated against COVID-19, second dose was January 21, 2020.  She required intubation after admission. Noted on arrival to [facility] to have a high amount of brown gastric secretions in OG tube aspirate.  Past Medical History  Hypertension Coronary artery disease history of MI Anemia, cause unspecified Hyperlipidemia   Significant Hospital Events   April 16, 2019 when admitted to [facility] April 17, 2020 intubated for worsening  respiratory failure with hypoxemia, transferred [facility] hospital  Consults:  Pulmonary and critical care medicine  Procedures:  July 3 endotracheal tube July 3 left IJ central venous line  Significant Diagnostic Tests:  July 2 V/Q scan > negative for PE July 3 CT chest images independently reviewed showing severe bilateral airspace disease with worsening consolidation and atelectasis in the bases, no pleural effusion  Micro Data:  7/1 blood >  7/2 SARS COV 2 > negative 7/2 SARS COV 2 > negative 7/2 urine >   Antimicrobials:  7/1 azithro >  7/1 ceftriaxone > 7/2 7/2 vanc >  7/3 cefepime >    Interim history/subjective:  As above  Objective   Blood pressure 135/71, pulse (!) 112, temperature (!) 103.3 F (39.6 C), temperature source Oral, resp. rate (!) 31, height 5' (1.524 m), weight 65 kg, SpO2 100 %.    Vent Mode: PRVC FiO2 (%):  [75 %-100 %] 90 % Set Rate:  [22 bmp-24 bmp] 24 bmp Vt Set:  [500 mL-530 mL] 500 mL PEEP:  [5 cmH20] 5 cmH20 Plateau Pressure:  [29 cmH20-30 cmH20] 30 cmH20   Intake/Output Summary (Last 24 hours) at 04/17/2020 1535 Last data filed at 04/17/2020 1335 Gross per 24 hour  Intake 700 ml  Output 410 ml  Net 290 ml   Filed Weights   05/05/2020 2328 04/17/20 1520  Weight: 57.2 kg 65 kg    Examination:  General:  In bed on vent HENT: NCAT ETT in place PULM: Crackles bilaterally B, vent supported breathing CV:  RRR, no mgr GI: BS+, soft, nontender MSK: normal bulk and tone Neuro: sedated on vent    Resolved Hospital Problem list     Assessment & Plan:  Severe acute respiratory failure with hypoxemia due to ARDS from severe community-acquired pneumonia PaO2 to FiO2 ratio very low; organism for CAP uncertain, SARS COV 2 negative x2 Admit to ICU Continue mechanical ventilation per ARDS protocol Target TVol 6-8cc/kgIBW Target Plateau Pressure < 30cm H20 Target driving pressure less than 15 cm of water Target PaO2 55-65: titrate  PEEP/FiO2 per protocol As long as PaO2 to FiO2 ratio is less than 1:150 position in prone position for 16 hours a day Check CVP daily if CVL in place Target CVP less than 4, diurese as necessary Ventilator associated pneumonia prevention protocol July 3 plan: Repeat ABG, determine if needs prone, placed central line, check CVP, bronchoscopy for BAL to assess etiology of severe community-acquired pneumonia, continue vancomycin, cefepime, azithromycin for now, send BAL cell count to assess for non-infectious etiologies, sequential BAL to assess for DAH  Acute kidney injury due to severe sepsis: Place central line, check CVP Continue IV fluids for now as clinically appears hypovolemic  Need for sedation for mechanical ventilation: PAD protocol Propofol and fentanyl infusions per PAD protocol As needed Versed per PAD protocol MD ordered RASS score is -2  High amount of gastric aspirate in OG tube NG to LWS Hold tube feeding Portable abdominal image  Hypertension at baseline: Hold home antihypertensives for now+  Best practice:  Diet: start tube feeding in AM Pain/Anxiety/Delirium protocol (if indicated): as above VAP protocol (if indicated): 3yes DVT prophylaxis: sub q heparin GI prophylaxis: pantoprazole Glucose control: SSI Mobility: bed rest Code Status: full Family Communication: I attempted to call her husband, he did not answer, could not leave a message Disposition: remain in ICU  Labs   CBC: Recent Labs  Lab 04/16/2020 2352 04/16/20 0621 04/17/20 0324  WBC 14.1* 14.3* 17.2*  NEUTROABS 12.3* 12.7*  --   HGB 8.7* 8.4* 7.4*  HCT 27.4* 27.2* 24.4*  MCV 82.3 84.5 85.6  PLT 255 245 149    Basic Metabolic Panel: Recent Labs  Lab 05/14/2020 2352 04/16/20 0621 04/17/20 0324  NA 133* 137 140  K 3.6 3.6 4.1  CL 100 102 106  CO2 20* 23 23  GLUCOSE 278* 192* 157*  BUN 32* 32* 26*  CREATININE 1.79* 1.59* 1.17*  CALCIUM 8.1* 8.2* 8.0*  MG  --  1.8 1.8    GFR: Estimated Creatinine Clearance: 40.9 mL/min (A) (by C-G formula based on SCr of 1.17 mg/dL (H)). Recent Labs  Lab 05/01/2020 2352 04/16/20 0201 04/16/20 0621 04/16/20 0824 04/17/20 0324  PROCALCITON  --   --   --  2.07 6.54  WBC 14.1*  --  14.3*  --  17.2*  LATICACIDVEN 2.8* 2.0*  --   --  1.8    Liver Function Tests: Recent Labs  Lab 04/20/2020 2352 04/16/20 0621 04/17/20 0324  AST 34 31 49*  ALT 51* 45* 52*  ALKPHOS 183* 164* 159*  BILITOT 1.0 0.7 0.9  PROT 6.7 6.2* 6.0*  ALBUMIN 2.6* 2.3* 2.1*   No results for input(s): LIPASE, AMYLASE in the last 168 hours. No results for input(s): AMMONIA in the last 168 hours.  ABG    Component Value Date/Time   PHART 7.418 04/17/2020 1338   PCO2ART 35.6 04/17/2020 1338   PO2ART 64.0 (L) 04/17/2020 1338   HCO3 23.3 04/17/2020 1338   TCO2 22  01/23/2011 1944   ACIDBASEDEF 1.3 04/17/2020 1338   O2SAT 90.8 04/17/2020 1338     Coagulation Profile: Recent Labs  Lab 05/15/2020 2352 04/16/20 0621  INR 1.5* 1.6*    Cardiac Enzymes: No results for input(s): CKTOTAL, CKMB, CKMBINDEX, TROPONINI in the last 168 hours.  HbA1C: Hemoglobin A1C  Date/Time Value Ref Range Status  03/15/2012 12:18 PM 8.5 (H) 4.2 - 6.3 % Final    Comment:    The American Diabetes Association recommends that a primary goal of therapy should be <7% and that physicians should reevaluate the treatment regimen in patients with HbA1c values consistently >8%.    Hgb A1c MFr Bld  Date/Time Value Ref Range Status  01/23/2011 08:19 PM (H) <5.7 % Final   11.1 (NOTE)                                                                       According to the ADA Clinical Practice Recommendations for 2011, when HbA1c is used as a screening test:   >=6.5%   Diagnostic of Diabetes Mellitus           (if abnormal result  is confirmed)  5.7-6.4%   Increased risk of developing Diabetes Mellitus  References:Diagnosis and Classification of Diabetes Mellitus,Diabetes  Care,2011,34(Suppl 1):S62-S69 and Standards of Medical Care in         Diabetes - 2011,Diabetes [id number],9417,40  (Suppl 1):S11-S61.  08/23/2007 02:40 PM (H)  Final   7.4 (NOTE)   The ADA recommends the following therapeutic goals for glycemic   control related to Hgb A1C measurement:   Goal of Therapy:   < 7.0% Hgb A1C   Action Suggested:  > 8.0% Hgb A1C   Ref:  Diabetes Care, 22, Suppl. 1, 1999    CBG: No results for input(s): GLUCAP in the last 168 hours.  Review of Systems:   Cannot obtain due to intubation  Past Medical History  She,  has a past medical history of Anemia, Hyperlipidemia, Hypertension, MI (myocardial infarction) ([place]), and Obesity.   Surgical History    Past Surgical History:  Procedure Laterality Date  . CARDIAC CATHETERIZATION    . TUBAL LIGATION       Social History   reports that she has never smoked. She has never used smokeless tobacco. She reports that she does not drink alcohol and does not use drugs.   Family History   Her family history includes Colon cancer (age of onset: 37) in her brother; Colon cancer (age of onset: 14) in her mother; Diabetes in her sister; Heart attack (age of onset: 36) in her father; Hypertension in her brother, sister, and sister; Stroke in her father.   Allergies No Known Allergies   Home Medications  Prior to Admission medications   Medication Sig Start Date End Date Taking? Authorizing Provider  aspirin 81 MG chewable tablet Chew by mouth daily.   Yes [provider]  diclofenac (CATAFLAM) 50 MG tablet Take 1 tablet (50 mg total) by mouth 3 (three) times daily for 20 days. 04/07/20 04/27/20 Yes Avegno, Darrelyn Hillock, FNP  diclofenac Sodium (VOLTAREN) 1 % GEL Apply 2 g topically 4 (four) times daily.   Yes [provider]  ferrous sulfate 325 (65 FE)  MG tablet Take 325 mg by mouth daily.   Yes [provider]  folic acid (FOLVITE) 1 MG tablet Take 1 mg by mouth daily.   Yes [provider]    labetalol (NORMODYNE) 200 MG tablet Take 200 mg by mouth 2 (two) times daily.   Yes [provider]  nitroGLYCERIN (NITROSTAT) 0.4 MG SL tablet Place 1 tablet under the tongue daily as needed.   Yes [provider]  amLODipine (NORVASC) 10 MG tablet Take 1 tablet (10 mg total) by mouth daily. Patient not taking: Reported on 04/16/2020 07/21/18   Mesner, Corene Cornea, MD  HYDROcodone-acetaminophen (NORCO/VICODIN) 5-325 MG tablet Take 1 tablet by mouth every 4 (four) hours as needed. Patient not taking: Reported on 04/16/2020 12/14/18   Virgel Manifold, MD  levofloxacin (LEVAQUIN) 500 MG tablet Take 1 tablet (500 mg total) by mouth daily. Patient not taking: Reported on 04/16/2020 12/14/18   Virgel Manifold, MD  lisinopril (PRINIVIL,ZESTRIL) 10 MG tablet Take 1 tablet (10 mg total) by mouth daily. Patient not taking: Reported on 04/16/2020 07/21/18   Mesner, Corene Cornea, MD  predniSONE (DELTASONE) 10 MG tablet Take 2 tablets (20 mg total) by mouth daily. Patient not taking: Reported on 04/16/2020 04/04/20   Emerson Monte, FNP     Critical care time: 60 minutes    Roselie Awkward, MD [facility] Pager: [phone] Cell: [phone] If no response, call [phone]

## 2020-04-17 NOTE — Progress Notes (Signed)
Pharmacy Antibiotic Note  Frances Miller is a 64 y.o. female admitted on 05-05-2020 with HCAP, required intubation 7/3.  Pharmacy has been consulted for vancomycin.  Plan: Continue Cefepime 2g IV every 12 hours. Received Vancomycin 1500mg  IV x 1 on 7/2 at 15:20, continue with 500mg  IV q12h Check vancomycin levels as needed Monitor labs, c/s, and patient improvement.  Height: 5' (152.4 cm) Weight: 65 kg (143 lb 4.8 oz) IBW/kg (Calculated) : 45.5  Temp (24hrs), Avg:99.6 F (37.6 C), Min:97.7 F (36.5 C), Max:103.3 F (39.6 C)  Recent Labs  Lab 05-05-2020 2352 04/16/20 0201 04/16/20 0621 04/17/20 0324  WBC 14.1*  --  14.3* 17.2*  CREATININE 1.79*  --  1.59* 1.17*  LATICACIDVEN 2.8* 2.0*  --  1.8    Estimated Creatinine Clearance: 40.9 mL/min (A) (by C-G formula based on SCr of 1.17 mg/dL (H)).    No Known Allergies  Antimicrobials this admission: Azithromycin 7/1>>   Cefepime 7/3 >> Vancomycin 7/2 x 1, resume 7/3 >>  Ceftriaxone 71>> 7/3   Microbiology results: 7/2 BC x2: ngtd 7/2 UCx: pending 7/2 Sputum: pending 7/2 MRSA PCR: negative   Thank you for allowing pharmacy to be a part of this patient's care.  9/2, PharmD, BCPS Pharmacy: [phone] 04/17/2020 5:30 PM

## 2020-04-17 NOTE — Progress Notes (Signed)
[facility]  Spoke to husband and sister by phone separately after many phone calls, updated both that the patient's condition is critical and her prognosis is guarded.    Advised they need to choose one family member for [place] to communicate with who can distribute information to the rest of the family.  Heber Vergas, MD [facility] Pager: [phone] Cell: [phone] If no response, call [phone]

## 2020-04-17 NOTE — Progress Notes (Signed)
Pharmacy Antibiotic Note  Frances Miller is a 64 y.o. female admitted on 05/11/2020 with HCAP.  Pharmacy has been consulted for Cefepime.  Plan: Cefepime 2000 mg IV every 12 hours. Monitor labs, c/s, and patient improvement.    Height: 5' (152.4 cm) Weight: 57.2 kg (126 lb) IBW/kg (Calculated) : 45.5  Temp (24hrs), Avg:99.2 F (37.3 C), Min:97.7 F (36.5 C), Max:103 F (39.4 C)  Recent Labs  Lab 04/24/2020 2352 04/16/20 0201 04/16/20 0621 04/17/20 0324  WBC 14.1*  --  14.3* 17.2*  CREATININE 1.79*  --  1.59* 1.17*  LATICACIDVEN 2.8* 2.0*  --  1.8    Estimated Creatinine Clearance: 38.5 mL/min (A) (by C-G formula based on SCr of 1.17 mg/dL (H)).    No Known Allergies  Antimicrobials this admission: azithromycin 7/1>>   Cefepime 7/3 >> vancomycin 7/1  >>  7/3 ceftriaxone 71>> 7/3   Microbiology results: 7/2 BC x2: ngtd 7/2 UCx:   pending 7/2 Sputum: pending MRSA PCR: negative    Thank you for allowing pharmacy to be a part of this patient's care.  Tad Moore 04/17/2020 12:56 PM

## 2020-04-18 LAB — GLUCOSE, CAPILLARY: Glucose-Capillary: 59 mg/dL — ABNORMAL LOW (ref 70–99)

## 2020-04-18 LAB — URINE CULTURE: Culture: <10000 — AB

## 2020-04-18 MED ORDER — EPINEPHRINE 1 MG/10ML IJ SOSY
PREFILLED_SYRINGE | INTRAMUSCULAR | Status: AC
  Filled 2020-04-18: qty 10

## 2020-04-18 MED ORDER — ALBUMIN HUMAN 5 % IV SOLN
12.5000 g | Freq: Once | INTRAVENOUS | Status: DC
  Filled 2020-04-18: qty 250

## 2020-04-18 MED ORDER — PHENYLEPHRINE HCL-NACL 10-0.9 MG/250ML-% IV SOLN
0.0000 ug/min | INTRAVENOUS | Status: DC
  Filled 2020-04-18: qty 250

## 2020-04-18 MED ORDER — EPINEPHRINE HCL 5 MG/250ML IV SOLN IN NS
0.5000 ug/min | INTRAVENOUS | Status: DC
  Filled 2020-04-18: qty 250

## 2020-04-18 MED ORDER — SODIUM BICARBONATE 8.4 % IV SOLN
INTRAVENOUS | Status: AC
  Filled 2020-04-18: qty 50

## 2020-04-18 MED ORDER — PHENYLEPHRINE HCL-NACL 10-0.9 MG/250ML-% IV SOLN
INTRAVENOUS | Status: AC
  Filled 2020-04-18: qty 250

## 2020-04-18 MED FILL — Medication: Qty: 1 | Status: AC

## 2020-04-20 LAB — ACID FAST SMEAR (AFB, MYCOBACTERIA): Acid Fast Smear: NEGATIVE

## 2020-04-20 LAB — CULTURE, RESPIRATORY W GRAM STAIN: Culture: NO GROWTH

## 2020-04-20 LAB — LEGIONELLA PNEUMOPHILA SEROGP 1 UR AG: L. pneumophila Serogp 1 Ur Ag: NEGATIVE

## 2020-04-21 LAB — CULTURE, BLOOD (ROUTINE X 2)
Culture: NO GROWTH
Culture: NO GROWTH
Special Requests: ADEQUATE
Special Requests: ADEQUATE

## 2020-05-12 LAB — FUNGUS CULTURE WITH STAIN

## 2020-05-12 LAB — FUNGUS CULTURE RESULT

## 2020-05-12 LAB — FUNGAL ORGANISM REFLEX

## 2020-05-16 NOTE — Discharge Summary (Signed)
DEATH SUMMARY   Patient Details  Name: Frances Miller MRN: [id number] DOB: 06-16-1956  Admission/Discharge Information   Admit Date:  April 16, 2020  Date of Death: Date of Death: Apr 19, 2020  Time of Death: Time of Death: 0243  Length of Stay: 2  Referring Physician: Carylon Perches, MD   Reason(s) for Hospitalization  Pneumonia  Diagnoses  Preliminary cause of death:  Secondary Diagnoses (including complications and co-morbidities):  Active Problems:   Sepsis (HCC)   Acute respiratory failure with hypoxemia (HCC)   Pulmonary infiltrates on CXR   Community acquired pneumonia   Elevated alkaline phosphatase level   Elevated ALT measurement   Brief Hospital Course (including significant findings, care, treatment, and services provided and events leading to death)  Frances Miller is a 64 y.o. year old female who with a history of HTN, MI, Anemia, HLD, admitted with pneumonia, community acquired.  Admitted 2023/04/17.   Developed worsening septic shock and refractory hypotension despite maximal pressors.  Developed cardiac arrest apparently due to worsening hypotension.   Pertinent Labs and Studies  Significant Diagnostic Studies DG Chest 1 View  Result Date: 04/17/2020 CLINICAL DATA:  Central line placement and repositioning of endotracheal tube. EXAM: CHEST  1 VIEW COMPARISON:  Single-view of the chest earlier today. FINDINGS: The patient's endotracheal tube has been pulled back and is now in good position with the tip 3 cm above the carina. New left IJ approach central venous catheter tip projects in the mid to upper superior vena cava. NG tube side port is just below the gastroesophageal junction. Diffuse bilateral airspace disease persists. No pneumothorax. Trace right pleural effusion. Heart size is normal. IMPRESSION: Support tubes and lines project in good position.  No pneumothorax. No change in diffuse bilateral airspace disease and a trace right pleural effusion. Electronically Signed    By: Drusilla Kanner M.D.   On: 04/17/2020 17:03   DG Knee 1-2 Views Left  Result Date: 04/16/2020 CLINICAL DATA:  64 year old female with sepsis and left knee pain. No known injury. EXAM: LEFT KNEE - 1-2 VIEW COMPARISON:  Left tib-fib series 04/07/2020. FINDINGS: Portable AP and lateral views. No evidence of left knee joint effusion. Joint spaces and alignment appear stable. No cortical osteolysis identified. No acute osseous abnormality identified. There does appear to be mild medial and anterior soft tissue swelling. IMPRESSION: Possible superficial soft tissue swelling but no evidence of joint effusion or acute osseous abnormality. Electronically Signed   By: Odessa Fleming M.D.   On: 04/16/2020 14:26   CT CHEST WO CONTRAST  Result Date: 04/17/2020 CLINICAL DATA:  Hypoxemia EXAM: CT CHEST WITHOUT CONTRAST TECHNIQUE: Multidetector CT imaging of the chest was performed following the standard protocol without IV contrast. COMPARISON:  Same day chest radiograph and CT chest dated 12/14/2018 FINDINGS: Cardiovascular: Vascular calcifications are seen in the coronary arteries and aortic arch. Normal heart size. No pericardial effusion. Mediastinum/Nodes: A few prominent supraclavicular and paratracheal lymph nodes are likely reactive. No enlarged mediastinal or axillary lymph nodes. Thyroid gland and esophagus demonstrate no significant findings. An enteric tube enters the stomach and terminates below the field of view. Lungs/Pleura: The endotracheal tube terminates 1.4 cm from the carina. Severe diffuse bilateral ground-glass opacities, consolidation, and interlobular septal line thickening are noted. There is no pleural effusion or pneumothorax. Upper Abdomen: No acute abnormality. Musculoskeletal: No chest wall mass or suspicious bone lesions identified. IMPRESSION: 1. Severe diffuse bilateral ground-glass opacities, consolidation, and interlobular septal line thickening likely represent pneumonia. Aortic  Atherosclerosis (ICD10-I70.0).  Electronically Signed   By: Romona Curls M.D.   On: 04/17/2020 10:57   NM Pulmonary Perfusion  Result Date: 04/16/2020 CLINICAL DATA:  Shortness of breath. EXAM: NUCLEAR MEDICINE PERFUSION LUNG SCAN TECHNIQUE: Perfusion images were obtained in multiple projections after intravenous injection of radiopharmaceutical. Ventilation scans intentionally deferred if perfusion scan and chest x-ray adequate for interpretation during COVID 19 epidemic. RADIOPHARMACEUTICALS:  4.4 mCi Tc-67m MAA IV COMPARISON:  Chest x-ray, same date. FINDINGS: No segmental or subsegmental perfusion defects to suggest pulmonary embolism. IMPRESSION: Negative V/Q scan for pulmonary embolism. Electronically Signed   By: Rudie Meyer M.D.   On: 04/16/2020 12:36   DG Chest Port 1 View  Result Date: 04/17/2020 CLINICAL DATA:  64 year old female status post intubation. EXAM: PORTABLE CHEST 1 VIEW COMPARISON:  Chest x-ray 04/16/2020. FINDINGS: An endotracheal tube is in place with tip 1.1 cm above the carina. A nasogastric tube is seen extending into the stomach, however, the tip of the nasogastric tube extends below the lower margin of the image. Worsening patchy multifocal airspace consolidation and diffuse interstitial prominence scattered throughout the lungs bilaterally (right greater than left), most confluent in the right mid lung. No pleural effusions. Heart size appears borderline enlarged. Upper mediastinal contours are within normal limits. IMPRESSION: 1. Support apparatus, as above. 2. Worsening aeration asymmetrically distributed throughout the lungs bilaterally, concerning for severe multilobar bilateral pneumonia. Electronically Signed   By: Trudie Reed M.D.   On: 04/17/2020 08:21   DG Chest Port 1 View  Result Date: 04/16/2020 CLINICAL DATA:  Fever, cough EXAM: PORTABLE CHEST 1 VIEW COMPARISON:  12/14/2018 FINDINGS: 2 frontal views of the chest demonstrate an unremarkable cardiac  silhouette. There are diffuse interstitial and ground-glass opacities throughout the lungs bilaterally. No effusion or pneumothorax. No acute bony abnormalities. IMPRESSION: 1. Bilateral interstitial and ground-glass opacities most compatible with atypical infection, including COVID-19. Other atypical organisms can give a similar pattern of infection, as can non infectious causes such as hypersensitivity pneumonitis. Electronically Signed   By: Sharlet Salina M.D.   On: 04/16/2020 00:43   DG Abd Portable 1V  Result Date: 04/17/2020 CLINICAL DATA:  Vomiting bile. EXAM: PORTABLE ABDOMEN - 1 VIEW COMPARISON:  None. FINDINGS: A nasogastric tube is seen with its distal tip overlying the body of the stomach. A single mildly dilated small bowel loop is seen overlying the medial aspect of the mid to lower left abdomen. Multiple radiopaque surgical clips are seen overlying the right upper quadrant and medial aspect of the mid to upper left abdomen. No radio-opaque calculi or other significant radiographic abnormality are seen. IMPRESSION: Nasogastric tube positioning, as described above, with additional findings suggestive of an early partial small bowel obstruction versus early ileus. Electronically Signed   By: Aram Candela M.D.   On: 04/17/2020 17:23    Microbiology No results found for this or any previous visit (from the past 240 hour(s)).  Lab Basic Metabolic Panel: No results for input(s): NA, K, CL, CO2, GLUCOSE, BUN, CREATININE, CALCIUM, MG, PHOS in the last 168 hours. Liver Function Tests: No results for input(s): AST, ALT, ALKPHOS, BILITOT, PROT, ALBUMIN in the last 168 hours. No results for input(s): LIPASE, AMYLASE in the last 168 hours. No results for input(s): AMMONIA in the last 168 hours. CBC: No results for input(s): WBC, NEUTROABS, HGB, HCT, MCV, PLT in the last 168 hours. Cardiac Enzymes: No results for input(s): CKTOTAL, CKMB, CKMBINDEX, TROPONINI in the last 168 hours. Sepsis  Labs: No results for input(s):  PROCALCITON, WBC, LATICACIDVEN in the last 168 hours.  Procedures/Operations  7/3 central line 7/3 bronchoscopy  7/3 Intubation  Charlotte Sanes 05/15/2020, 3:17 PM

## 2020-05-16 NOTE — Progress Notes (Signed)
Patient code began at 02:10 and time of death was called at 2:43. No spontaneous heartbeat or breaths auscultated for 1 full minute after TOD and asystole was confirmed by 2 RNs. Patients family was informed while code was in progress and arrived after time of death had been called. Primary RN, charge RN, and house supervisor spoke with family about events that transpired leading up to and during the code. Family was understanding and appreciative of all efforts. Patients belongings sent home with son.

## 2020-05-16 NOTE — ED Provider Notes (Signed)
[facility]  Department of Emergency Medicine   Code Blue CONSULT NOTE  Chief Complaint: Cardiac arrest/unresponsive   Level V Caveat: Unresponsive  History of present illness: I was contacted by the hospital for a CODE BLUE cardiac arrest upstairs and presented to the patient's bedside.    ROS: Unable to obtain, Level V caveat  Scheduled Meds: . artificial tears  1 application Both Eyes K2H  . aspirin  81 mg Oral Daily  . chlorhexidine gluconate (MEDLINE KIT)  15 mL Mouth Rinse BID  . Chlorhexidine Gluconate Cloth  6 each Topical Daily  . EPINEPHrine      . EPINEPHrine      . EPINEPHrine      . feeding supplement (ENSURE ENLIVE)  237 mL Oral BID BM  . feeding supplement (PRO-STAT SUGAR FREE 64)  30 mL Per Tube BID  . fentaNYL (SUBLIMAZE) injection  50 mcg Intravenous Once  . ferrous sulfate  325 mg Oral Daily  . folic acid  1 mg Oral Daily  . free water  200 mL Per Tube Q4H  . heparin  5,000 Units Subcutaneous Q8H  . mouth rinse  15 mL Mouth Rinse 10 times per day  . rocuronium bromide      . sodium bicarbonate      . sodium chloride flush  10-40 mL Intracatheter Q12H   Continuous Infusions: . albumin human    . azithromycin Stopped (04/17/20 2314)  . ceFEPime (MAXIPIME) IV 2 g (2020/04/21 0122)  . epinephrine    . fentaNYL infusion INTRAVENOUS 200 mcg/hr (Apr 21, 2020 0000)  . midazolam 4 mg/hr (04/21/20 0000)  . norepinephrine (LEVOPHED) Adult infusion 32 mcg/min (04/21/2020 0000)  . phenylephrine (NEO-SYNEPHRINE) Adult infusion    . phenylephrine    . phenylephrine    . vancomycin Stopped (04/17/20 1949)  . vasopressin (PITRESSIN) infusion - *FOR SHOCK* 0.04 Units/min (04-21-20 0121)   PRN Meds:.acetaminophen, fentaNYL, midazolam, sodium chloride flush, vecuronium Past Medical History:  Diagnosis Date  . Anemia   . Hyperlipidemia   . Hypertension   . MI (myocardial infarction) ([place])   . Obesity    Past Surgical History:  Procedure Laterality Date   . CARDIAC CATHETERIZATION    . TUBAL LIGATION     Social History   Socioeconomic History  . Marital status: Married    Spouse name: Not on file  . Number of children: 1  . Years of education: Not on file  . Highest education level: Not on file  Occupational History  . Occupation: Scientist, water quality  Tobacco Use  . Smoking status: Never Smoker  . Smokeless tobacco: Never Used  Substance and Sexual Activity  . Alcohol use: No  . Drug use: No  . Sexual activity: Yes    Birth control/protection: Surgical  Other Topics Concern  . Not on file  Social History Narrative  . Not on file   Social Determinants of Health   Financial Resource Strain:   . Difficulty of Paying Living Expenses:   Food Insecurity:   . Worried About Charity fundraiser in the Last Year:   . Arboriculturist in the Last Year:   Transportation Needs:   . Film/video editor (Medical):   Marland Kitchen Lack of Transportation (Non-Medical):   Physical Activity:   . Days of Exercise per Week:   . Minutes of Exercise per Session:   Stress:   . Feeling of Stress :   Social Connections:   . Frequency of Communication  with Friends and Family:   . Frequency of Social Gatherings with Friends and Family:   . Attends Religious Services:   . Active Member of Clubs or Organizations:   . Attends Archivist Meetings:   Marland Kitchen Marital Status:   Intimate Partner Violence:   . Fear of Current or Ex-Partner:   . Emotionally Abused:   Marland Kitchen Physically Abused:   . Sexually Abused:    No Known Allergies  Last set of Vital Signs (not current) Vitals:   15-May-2020 0238 2020/05/15 0247  BP: (!) 98/22   Pulse:    Resp:  (!) 0  Temp:    SpO2:        Physical Exam  Gen: unresponsive Cardiovascular: pulseless  Resp: apneic. Breath sounds equal bilaterally with bagging  Abd: nondistended  Neuro: GCS 3, unresponsive to pain  HEENT: No blood in posterior pharynx, gag reflex absent  Neck: No crepitus  Musculoskeletal: No deformity   Skin: warm   CRITICAL CARE Performed by: Raychell Holcomb K Anecia Nusbaum-Rasch Total critical care time: 30 Critical care time was exclusive of separately billable procedures and treating other patients. Critical care was necessary to treat or prevent imminent or life-threatening deterioration. Critical care was time spent personally by me on the following activities: development of treatment plan with patient and/or surrogate as well as nursing, discussions with consultants, evaluation of patient's response to treatment, examination of patient, obtaining history from patient or surrogate, ordering and performing treatments and interventions, ordering and review of laboratory studies, ordering and review of radiographic studies, pulse oximetry and re-evaluation of patient's condition.  Cardiopulmonary Resuscitation (CPR) Procedure Note  Directed/Performed by: Reynalda Canny K Magan Winnett-Rasch I personally directed ancillary staff and/or performed CPR in an effort to regain return of spontaneous circulation and to maintain cardiac, neuro and systemic perfusion.    Medical Decision making  PEA arrest in patient with ARDS  Directed 5 rounds of epinephrine with continuous CPR.  Bicarb drip initiated by me and an amp of D50 was given.    ICU attending presented to the code and stated she was assuming care of the patient.    CPR continued at the time I left.     Assessment and Plan  Cardiac arrest   Veatrice Kells, MD 2020/05/15 [phone]

## 2020-05-16 NOTE — Progress Notes (Signed)
CDS called. Referral number [id number] Pt suitable for eye and tissue donation. Bedside RN notified

## 2020-05-16 NOTE — Progress Notes (Signed)
eLink Physician-Brief Progress Note Patient Name: Frances Miller DOB: Nov 16, 1955 MRN: [id number]   Date of Service  05/08/2020  HPI/Events of Note  Soft blood pressures measured non-invasively,  Saturation 90 % on 100 % FIO2.  eICU Interventions  Vasopressin increased to 0.04, arterial line and ABG ordered.Thomasene Lot Kelli Egolf 05/09/2020, 1:19 AM

## 2020-05-16 NOTE — Progress Notes (Addendum)
Called by Dr. Warrick Parisian for help with this patient with worsening hypotension at about 204.  I arrived at 229 and CPR had been in progress since 210.    Per my review with her primary nurse, she had had worsening hypotension requiring increasing doses of Levophed, from 57mcg/hr up to 62mcg/hr by the time of her arrest, with vasopressin added at 2300.  Phenylephrine and epinephrine infusion had also been added near to the time of her arrest.    Her oxygenation need has been consistently at 100% fio2, PEEP 12, with peak pressures at about 30 per the chart review.  She had an episode near shift change of hypoxemia at 87%, improved with recruitment maneuvers.    On my arrival, several doses of epinephrine had been given (>5 doses, see CPR documentation), as well as bicarb x 2.  Had been started on bicarb gtt.    Pulse checks remained PEA bradycardia with wide complex, pulseless for the duration of the code.  Breath sounds symmetric, easy to bag, no signs of PTX.    Unable to get good view of heart, but no cardiac motion visible on brief [place] image.   Developed bloody secretions from ET tube.    Concluded CPR at 243 am.  Discussed with husband and son at bedside.

## 2020-05-16 NOTE — Progress Notes (Signed)
Pt removed from vent after CCMD pronounced pt deceased.  ETT remains in place.

## 2020-05-16 NOTE — Progress Notes (Signed)
eLink Physician-Brief Progress Note Patient Name: Frances Miller DOB: 26-Nov-1955 MRN: [id number]   Date of Service  05/08/2020  HPI/Events of Note  Patient  With bradycardia and hypotension treated with Atropine, Norepinephrine bolus , then infusion, limited fluid bolus, in the context of hypoxemia related to her pulmonary disease, Patient initially responded to Rx but subsequently developed PEA cardiac arrest, ACLS protocol was initiated, patient received prolonged resuscitation including a brief shock for ventricular fibrillation, the PCCM ground crew was requested to come and take over resuscitation, Dr. Charlotte Sanes arrived and took over resuscitation but despite prolonged attempts to revive patient ROSC was not sustainably realized. Dr. Marcos Eke ultimately decided to terminate resuscitative efforts for futility, and patient was pronounced dead. During the period of deterioration I attempted to reach Mr. Frances Miller by his listed phone number in Epic but the call went to voicemail, during the code I was able to speak with him and update him regarding ongoing resuscitative efforts, Both Dr. Marcos Eke and I independently spoke to him after the code was called.  eICU Interventions  Pre-code blue resuscitative attempts, and code blue resuscitation after patient lost his rhythm. Discussion with patient's spouse.        Frances Miller 04/16/2020, 5:05 AM

## 2020-05-16 NOTE — Progress Notes (Addendum)
Prior to code, RT attempted aline x2 but unsuccessful.  Systolic blood pressure at the time of initial attempt was  in the 50s.  Abg also attempted by another RT but was also unsuccessful.

## 2020-05-16 DEATH — deceased

## 2020-06-03 LAB — ACID FAST CULTURE WITH REFLEXED SENSITIVITIES (MYCOBACTERIA): Acid Fast Culture: NEGATIVE

## 2021-09-30 IMAGING — DX DG TIBIA/FIBULA 2V*L*
2 series · 2 of 2 positions shown · non-contrast
Comparison: None.

CLINICAL DATA: Lower left leg pain x1 week.

EXAM:
LEFT TIBIA AND FIBULA - 2 VIEW

[tib/fib ap]
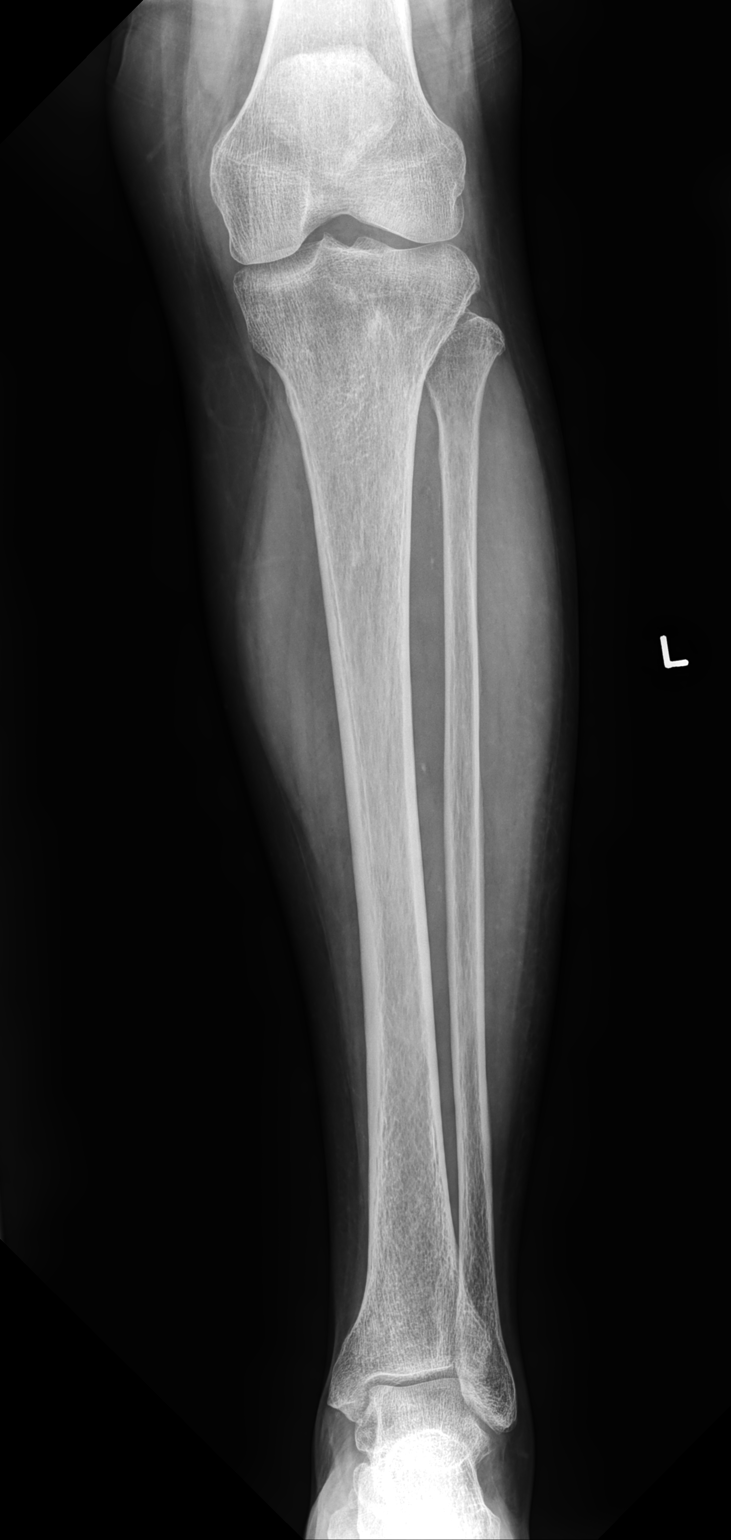

[tib/fib lat]
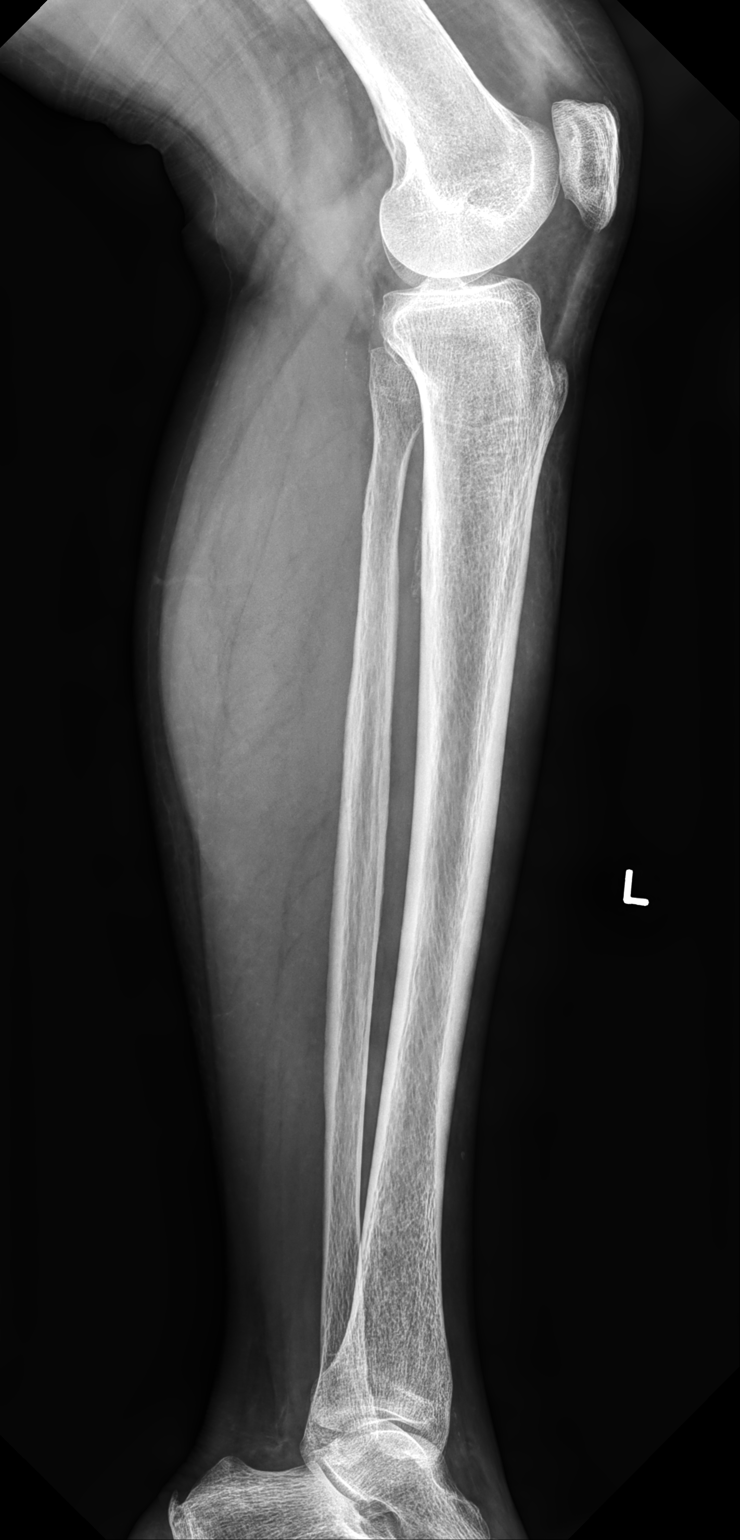

[2 of 2 positions shown; findings below may reference images not displayed]

FINDINGS: There is no evidence of fracture or other focal bone lesions. Soft
tissues are unremarkable.
IMPRESSION: Negative.
